# Patient Record
Sex: Male | Born: 1975
Health system: Southern US, Community
[De-identification: ages and names within clinical notes are randomized; demographics above are authoritative.]

## PROBLEM LIST (undated history)

## (undated) DIAGNOSIS — Z789 Other specified health status: Secondary | ICD-10-CM

## (undated) DIAGNOSIS — J189 Pneumonia, unspecified organism: Secondary | ICD-10-CM

## (undated) DIAGNOSIS — I10 Essential (primary) hypertension: Secondary | ICD-10-CM

## (undated) HISTORY — PX: EYE SURGERY: SHX253

## (undated) HISTORY — PX: TONSILLECTOMY: SUR1361

## (undated) HISTORY — PX: KNEE SURGERY: SHX244

---

## 2005-09-04 ENCOUNTER — Emergency Department (HOSPITAL_COMMUNITY): Admission: EM | Admit: 2005-09-04 | Discharge: 2005-09-04 | Payer: Self-pay | Admitting: Emergency Medicine

## 2006-07-26 ENCOUNTER — Emergency Department (HOSPITAL_COMMUNITY): Admission: EM | Admit: 2006-07-26 | Discharge: 2006-07-26 | Payer: Self-pay | Admitting: Emergency Medicine

## 2017-02-10 ENCOUNTER — Emergency Department (HOSPITAL_COMMUNITY)
Admission: EM | Admit: 2017-02-10 | Discharge: 2017-02-10 | Disposition: A | Discharge status: Home / Self Care | Payer: Worker's Compensation | Attending: Emergency Medicine | Admitting: Emergency Medicine

## 2017-02-10 ENCOUNTER — Emergency Department (HOSPITAL_COMMUNITY): Payer: Worker's Compensation

## 2017-02-10 DIAGNOSIS — Y9339 Activity, other involving climbing, rappelling and jumping off: Secondary | ICD-10-CM | POA: Diagnosis not present

## 2017-02-10 DIAGNOSIS — M25511 Pain in right shoulder: Secondary | ICD-10-CM

## 2017-02-10 DIAGNOSIS — Y929 Unspecified place or not applicable: Secondary | ICD-10-CM | POA: Diagnosis not present

## 2017-02-10 DIAGNOSIS — W1789XA Other fall from one level to another, initial encounter: Secondary | ICD-10-CM | POA: Diagnosis not present

## 2017-02-10 DIAGNOSIS — S4991XA Unspecified injury of right shoulder and upper arm, initial encounter: Secondary | ICD-10-CM | POA: Insufficient documentation

## 2017-02-10 DIAGNOSIS — Y998 Other external cause status: Secondary | ICD-10-CM | POA: Diagnosis not present

## 2017-02-10 DIAGNOSIS — S29011A Strain of muscle and tendon of front wall of thorax, initial encounter: Secondary | ICD-10-CM | POA: Insufficient documentation

## 2017-02-10 DIAGNOSIS — M7581 Other shoulder lesions, right shoulder: Secondary | ICD-10-CM

## 2017-02-10 MED ORDER — HYDROCODONE-ACETAMINOPHEN 5-325 MG PO TABS
2.0000 | ORAL_TABLET | Freq: Once | ORAL | Status: AC
  Administered 2017-02-10: 2 via ORAL
  Filled 2017-02-10: qty 2

## 2017-02-10 MED ORDER — HYDROCODONE-ACETAMINOPHEN 5-325 MG PO TABS: 2.0000 | ORAL_TABLET | ORAL | 0 refills | Status: DC | PRN

## 2017-02-10 NOTE — Discharge Instructions (Signed)
Follow-up with referred orthopedic doctor. Call his office and arrange for an appointment.  Take pain medications as directed for pain.  Make sure you're applying ice to help with the pain.  Return the emergency Department for any worsening pain, numbness or weakness, redness or swelling or any other worsening or concerning symptoms.

## 2017-02-10 NOTE — ED Provider Notes (Signed)
[facility]-EMERGENCY DEPT Provider Note   CSN: [id number] Arrival date & time: 02/10/17  1601  By signing my name below, I, [name], attest that this documentation has been prepared under the direction and in the presence of [name], PA-C. Electronically Signed: [name], Scribe. 02/10/2017. 5:05 PM.  History   Chief Complaint Chief Complaint  Patient presents with  . Shoulder Pain   The history is provided by the patient. No language interpreter was used.   HPI Comments: Mathew Cantrell is a 41 y.o. male who presents to the Emergency Department complaining of sudden onset, constant anterior right shoulder pain radiating into the biceps beginning earlier this afternoon. He was doing a high ropes course while harnessed prior to arrival. Patient states he and other climbers fell off a platform and became suspended in the air by their harnesses. He is unsure if his injury is a result of holding the rope with his right hand or another climber striking his right shoulder during the fall. Patient reports severe anterior right shoulder and biceps pain that is worse with movement of his right upper extremity. He endorses some associated swelling as well. Patient applied ice to his anterior right shoulder shortly after the injury was sustained but has otherwise not tried any medications or treatments. NKDA. Patient denies numbness/tingling, focal weakness, bruising, open wounds, or any other associated symptoms.  No past medical history on file.  There are no active problems to display for this patient.   No past surgical history on file.     Home Medications    Prior to Admission medications   Medication Sig Start Date End Date Taking? Authorizing Provider  HYDROcodone-acetaminophen (NORCO/VICODIN) 5-325 MG tablet Take 2 tablets by mouth every 4 (four) hours as needed. 02/10/17   Maxwell Caul, PA-C    Family History No family history on file.  Social History Social  History  Substance Use Topics  . Smoking status: Not on file  . Smokeless tobacco: Not on file  . Alcohol use Not on file     Allergies   Patient has no known allergies.   Review of Systems Review of Systems  Musculoskeletal: Positive for arthralgias, joint swelling and myalgias.  Skin: Negative for color change and wound.  Neurological: Negative for weakness and numbness.   Physical Exam Updated Vital Signs BP (!) 161/121 (BP Location: Left Arm)   Pulse 93   Temp 97.8 F (36.6 C) (Oral)   Resp 16   SpO2 99%   Physical Exam  Constitutional: He is oriented to person, place, and time. He appears well-developed and well-nourished.  Appears uncomfortable  HENT:  Head: Normocephalic and atraumatic.  Eyes: Conjunctivae and EOM are normal. Right eye exhibits no discharge. Left eye exhibits no discharge. No scleral icterus.  Cardiovascular:  Pulses:      Radial pulses are 2+ on the right side, and 2+ on the left side.  Pulmonary/Chest: Effort normal.  Tenderness palpation to the right pectoralis major muscle of the anterior chest with overlying soft tissue swelling.  Musculoskeletal:       Left shoulder: Normal.  Full range of motion of left shoulder. Decreased range of motion of right shoulder secondary to pain. Patient is able to get to about 100 abduction of right upper extremity with pain. No decreased strength. Worsening pain with external and internal rotation of the right upper extremity. Tenderness to palpation to the right forearm area overlying the biceps. No deformity or crepitus noted. No palpable  mass. Negative Hawkins, impingement, empty can test, lift off test. No tenderness palpation to the clavicles bilaterally. No deformity noted. No tenderness palpation to the right elbow, right wrist.  Neurological: He is alert and oriented to person, place, and time. GCS eye subscore is 4. GCS verbal subscore is 5. GCS motor subscore is 6.  Moving all extremities spontaneously,  though limited range of motion of right upper extremity. Sensation intact.   Skin: Skin is warm and dry.  Psychiatric: He has a normal mood and affect. His speech is normal and behavior is normal.  Nursing note and vitals reviewed.  ED Treatments / Results  Labs (all labs ordered are listed, but only abnormal results are displayed) Labs Reviewed - No data to display  EKG  EKG Interpretation None       Radiology Dg Shoulder Right  Result Date: 02/10/2017 CLINICAL DATA:  Pt c/o right shoulder pain onset today after falling off of a platform during training, was in a harness to prevent fall. Unsure of mechanism of injury, may have grabbed a rope and pulled or twisted his shoulder, friend on rope c.*comment was truncated* EXAM: RIGHT SHOULDER - 2+ VIEW COMPARISON:  None. FINDINGS: There is no evidence of fracture or dislocation. There is no evidence of arthropathy or other focal bone abnormality. Soft tissues are unremarkable. IMPRESSION: Negative. Electronically Signed   By: Norva PavlovElizabeth  Brown M.D.   On: 02/10/2017 16:55    Procedures Procedures (including critical care time)  DIAGNOSTIC STUDIES: Oxygen Saturation is 99% on RA, normal by my interpretation.    COORDINATION OF CARE: 5:02 PM Discussed treatment plan with pt at bedside and pt agreed to plan.  Medications Ordered in ED Medications  HYDROcodone-acetaminophen (NORCO/VICODIN) 5-325 MG per tablet 2 tablet (2 tablets Oral Given 02/10/17 1731)     Initial Impression / Assessment and Plan / ED Course  I have reviewed the triage vital signs and the nursing notes.  Pertinent labs & imaging results that were available during my care of the patient were reviewed by me and considered in my medical decision making (see chart for details).     41 year old male who presents with right shoulder/arm pain after a injury at a ropes course today. Patient is neurovascularly intact. Consider muscle strain versus ligament injury versus  tendon injury. Also consider fracture versus dislocation, the low suspicion. History/physical exam are not concerning for a rotator cuff pathology or biceps tendon tear. Initial x-rays ordered at triage. Analgesics given in the department.  X-ray reviewed. Negative for any acute fracture or dislocation. Discussed results with patient.  Discussed patient with Dr. Juleen ChinaKohut. Given history/physical exam, concern for a pectoralis muscle tear. Discussed concerns with patient. Will provide arm sling for comfort and support. Instructed patient to follow-up with referred orthopedic doctor for further evaluation. Will plan to give patient a short course of pain medications for symptomatic relief. Strict return precautions discussed. Patient expresses understanding and agreement plan.  Final Clinical Impressions(s) / ED Diagnoses   Final diagnoses:  Acute pain of right shoulder  Tendinitis of right pectoralis major    New Prescriptions Discharge Medication List as of 02/10/2017  6:54 PM    START taking these medications   Details  HYDROcodone-acetaminophen (NORCO/VICODIN) 5-325 MG tablet Take 2 tablets by mouth every 4 (four) hours as needed., Starting Fri 02/10/2017, Print       I personally performed the services described in this documentation, which was scribed in my presence. The recorded information has been reviewed  and is accurate.    Maxwell Caul, PA-C 02/10/17 2009    Raeford Razor, MD 02/17/17 1148

## 2017-02-10 NOTE — ED Triage Notes (Signed)
Pt c/o right shoulder pain onset today after falling off of a platform, was in a harness to prevent fall.  Unsure of mechanism of injury, may have grabbed a rope and pulled or twisted his shoulder, friend on rope course may have hit pt in shoulder as they fell.   Pain worse with ROM, initially pain was "stinger" like pain with numbness. No current numbness. Bulging to right anterior shoulder.

## 2017-02-10 NOTE — ED Provider Notes (Signed)
Medical screening examination/treatment/procedure(s) were conducted as a shared visit with non-physician practitioner(s) and myself.  I personally evaluated the patient during the encounter.   EKG Interpretation None       40yM with R shoulder pain. I suspect he may have torn R pec. Imaging negative. NVI. Sling. PRN pain meds. Ortho FU. He is a Emergency planning/management officerpolice officer. If he did indeed tear it then likely out for at least weeks. Activity instructions discussed.   Dg Shoulder Right  Result Date: 02/10/2017 CLINICAL DATA:  Pt c/o right shoulder pain onset today after falling off of a platform during training, was in a harness to prevent fall. Unsure of mechanism of injury, may have grabbed a rope and pulled or twisted his shoulder, friend on rope c.*comment was truncated* EXAM: RIGHT SHOULDER - 2+ VIEW COMPARISON:  None. FINDINGS: There is no evidence of fracture or dislocation. There is no evidence of arthropathy or other focal bone abnormality. Soft tissues are unremarkable. IMPRESSION: Negative. Electronically Signed   By: Norva PavlovElizabeth  Brown M.D.   On: 02/10/2017 16:55     Raeford RazorKohut, Sugey Trevathan, MD 02/22/17 [phone]

## 2018-07-23 ENCOUNTER — Encounter (HOSPITAL_BASED_OUTPATIENT_CLINIC_OR_DEPARTMENT_OTHER): Payer: Self-pay | Admitting: Emergency Medicine

## 2018-07-23 ENCOUNTER — Other Ambulatory Visit: Payer: Self-pay

## 2018-07-23 ENCOUNTER — Emergency Department (HOSPITAL_BASED_OUTPATIENT_CLINIC_OR_DEPARTMENT_OTHER): Payer: No Typology Code available for payment source

## 2018-07-23 ENCOUNTER — Emergency Department (HOSPITAL_BASED_OUTPATIENT_CLINIC_OR_DEPARTMENT_OTHER)
Admission: EM | Admit: 2018-07-23 | Discharge: 2018-07-23 | Disposition: A | Discharge status: Home / Self Care | Payer: No Typology Code available for payment source | Attending: Emergency Medicine | Admitting: Emergency Medicine

## 2018-07-23 DIAGNOSIS — M545 Low back pain, unspecified: Secondary | ICD-10-CM

## 2018-07-23 MED ORDER — FENTANYL CITRATE (PF) 100 MCG/2ML IJ SOLN
50.0000 ug | Freq: Once | INTRAMUSCULAR | Status: AC
  Administered 2018-07-23: 50 ug via INTRAVENOUS
  Filled 2018-07-23: qty 2

## 2018-07-23 MED ORDER — DIAZEPAM 10 MG PO TABS: 10.0000 mg | ORAL_TABLET | Freq: Three times a day (TID) | ORAL | 0 refills | Status: DC | PRN

## 2018-07-23 MED ORDER — METHOCARBAMOL 500 MG PO TABS
500.0000 mg | ORAL_TABLET | Freq: Once | ORAL | Status: AC
  Administered 2018-07-23: 500 mg via ORAL
  Filled 2018-07-23: qty 1

## 2018-07-23 MED ORDER — HYDROCODONE-ACETAMINOPHEN 5-325 MG PO TABS
1.0000 | ORAL_TABLET | Freq: Once | ORAL | Status: AC
  Administered 2018-07-23: 1 via ORAL
  Filled 2018-07-23: qty 1

## 2018-07-23 MED ORDER — MORPHINE SULFATE (PF) 4 MG/ML IV SOLN
4.0000 mg | Freq: Once | INTRAVENOUS | Status: AC
  Administered 2018-07-23: 4 mg via INTRAMUSCULAR
  Filled 2018-07-23: qty 1

## 2018-07-23 MED ORDER — KETOROLAC TROMETHAMINE 15 MG/ML IJ SOLN
15.0000 mg | Freq: Once | INTRAMUSCULAR | Status: AC
  Administered 2018-07-23: 15 mg via INTRAMUSCULAR
  Filled 2018-07-23: qty 1

## 2018-07-23 MED ORDER — DIAZEPAM 5 MG PO TABS
10.0000 mg | ORAL_TABLET | Freq: Once | ORAL | Status: AC
  Administered 2018-07-23: 10 mg via ORAL
  Filled 2018-07-23: qty 2

## 2018-07-23 MED FILL — diazePAM 10 MG TABS: 10 | 7 days supply | Qty: 20 | Fill #0

## 2018-07-23 NOTE — Discharge Instructions (Signed)
Please alternate Tylenol and Ibuprofen for pain Take Tizanidine as needed for muscle pain/spasms Take Valium as needed for spasms Apply heat to stiff/sore muscles Return if worsening

## 2018-07-23 NOTE — ED Provider Notes (Signed)
[facility] EMERGENCY DEPARTMENT Provider Note   CSN: [id number] Arrival date & time: 07/23/18  1034     History   Chief Complaint Chief Complaint  Patient presents with  . Back Pain    HPI Mathew Cantrell is a 42 y.o. male who presents with back pain.  No significant past medical history.  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  He had to crawl to get himself up and then had to help people around him who had also fallen over.  He initially had some mild to moderate low back pain and went to see a doctor and was prescribed muscle relaxers.  He states that he has been feeling well up and until this past week.  On Monday, Tuesday, Wednesday he exercised and felt okay.  About 3 days ago he started to have progressively worsening low back pain.  He started the muscle relaxer again which did not provide relief.  Today when he tried to get up to get ready for work he had intense whole back spasming and states that his back "locked up".  And he was not able to walk.  He denies any pain going down the legs.  No bowel or bladder incontinence. No fever, syncope, unexplained weight loss, hx of cancer, loss of bowel/bladder function, saddle anesthesia, urinary retention, IVDU.  HPI  History reviewed. No pertinent past medical history.  There are no active problems to display for this patient.   Past Surgical History:  Procedure Laterality Date  . KNEE SURGERY    . TONSILLECTOMY          Home Medications    Prior to Admission medications   Medication Sig Start Date End Date Taking? Authorizing Provider  tiZANidine (ZANAFLEX) 4 MG capsule  07/18/18   [provider]    Family History No family history on file.  Social History Social History   Tobacco Use  . Smoking status: Never Smoker  . Smokeless  tobacco: Never Used  Substance Use Topics  . Alcohol use: Not on file  . Drug use: Not on file     Allergies   Patient has no known allergies.   Review of Systems Review of Systems  Constitutional: Negative for fever.  Gastrointestinal: Negative for abdominal pain.  Musculoskeletal: Positive for back pain and myalgias.  Skin: Negative for wound.  Allergic/Immunologic: Negative for immunocompromised state.  Neurological: Negative for weakness and numbness.  All other systems reviewed and are negative.    Physical Exam Updated Vital Signs BP (!) 143/87   Pulse (!) 55   Temp 97.9 F (36.6 C) (Oral)   Resp 20   Ht 6\' 4"  (1.93 m)   Wt 115.7 kg   SpO2 100%   BMI 31.04 kg/m   Physical Exam  Constitutional: He is oriented to person, place, and time. He appears well-developed and well-nourished. He appears distressed (mildly due to pain).  Calm and cooperative  HENT:  Head: Normocephalic and atraumatic.  Eyes: Pupils are equal, round, and reactive to light. Conjunctivae are normal. Right eye exhibits no discharge. Left eye exhibits no discharge. No scleral icterus.  Neck: Normal range of motion.  Cardiovascular: Normal rate.  Pulmonary/Chest: Effort normal. No respiratory distress.  Abdominal: Soft. Bowel sounds are normal. He exhibits no distension.  There is no tenderness.  Musculoskeletal:  Back: Inspection: No masses, deformity, or rash Palpation: No midline spinal tenderness. No paraspinal muscle tenderness. Strength: 5/5 in lower extremities and normal plantar and dorsiflexion Sensation: Intact sensation with light touch in lower extremities bilaterally Gait: Pt is not able to stand   Neurological: He is alert and oriented to person, place, and time.  Skin: Skin is warm and dry.  Psychiatric: He has a normal mood and affect. His behavior is normal.  Nursing note and vitals reviewed.    ED Treatments / Results  Labs (all labs ordered are listed, but only  abnormal results are displayed) Labs Reviewed - No data to display  EKG None  Radiology Dg Lumbar Spine Complete  Result Date: 07/23/2018 CLINICAL DATA:  42 year old male with low back pain for the past month. Initial encounter. EXAM: LUMBAR SPINE - COMPLETE 4+ VIEW COMPARISON:  12/05/2007. FINDINGS: Slight curvature lumbar spine convex right. No compression fracture or pars defect. Mild L1-2 and L5-S1 disc space narrowing. Minimal Schmorl's node deformity L4-5. Sacroiliac joints appear intact. IMPRESSION: 1. Slight curvature lumbar spine convex right. 2. No compression fracture or pars defect. 3. Mild L1-2 and L5-S1 disc space narrowing. 4. Minimal Schmorl's node deformity L4-5. Electronically Signed   By: Lacy Duverney M.D.   On: 07/23/2018 12:28    Procedures Procedures (including critical care time)  Medications Ordered in ED Medications  ketorolac (TORADOL) 15 MG/ML injection 15 mg (15 mg Intramuscular Given 07/23/18 1201)  HYDROcodone-acetaminophen (NORCO/VICODIN) 5-325 MG per tablet 1 tablet (1 tablet Oral Given 07/23/18 1200)  methocarbamol (ROBAXIN) tablet 500 mg (500 mg Oral Given 07/23/18 1200)  morphine 4 MG/ML injection 4 mg (4 mg Intramuscular Given 07/23/18 1339)  diazepam (VALIUM) tablet 10 mg (10 mg Oral Given 07/23/18 1512)  fentaNYL (SUBLIMAZE) injection 50 mcg (50 mcg Intravenous Given 07/23/18 1519)     Initial Impression / Assessment and Plan / ED Course  I have reviewed the triage vital signs and the nursing notes.  Pertinent labs & imaging results that were available during my care of the patient were reviewed by me and considered in my medical decision making (see chart for details).  42 year old male presents with acute low back pain. Most likely due to muscle spasms. He has no radicular pain. No bowel/bladder incontinence. No red flags. Will obtain lumbar plain film due to his injury a couple weeks ago and provide pain control.  After Toradol, Norco,  Robaxin, the patient is not able to get up. Will apply heating pad and give IM Morphine.  Pt still not able to get up. Will give IV Fentanyl and Valium.   Reassess pt. He is able to stand and walk with some difficulty. He wants to go home. He will be given rx for Valium and advised to alternate Tylenol/Ibuprofen and use heating pads. Advised return if worsening  Final Clinical Impressions(s) / ED Diagnoses   Final diagnoses:  Acute bilateral low back pain without sciatica    ED Discharge Orders    None       Bethel Born, PA-C 07/23/18 1619    Jacalyn Lefevre, MD 07/24/18 [phone]

## 2018-07-23 NOTE — ED Triage Notes (Signed)
Pt c/o back pain; recent injury when he was smashed between 2 bike racks while working; back pain has become much worse since Sat and is now having spasms

## 2018-08-05 ENCOUNTER — Emergency Department (HOSPITAL_COMMUNITY): Payer: BC Managed Care – PPO

## 2018-08-05 ENCOUNTER — Encounter (HOSPITAL_COMMUNITY): Payer: Self-pay | Admitting: Emergency Medicine

## 2018-08-05 ENCOUNTER — Emergency Department (HOSPITAL_COMMUNITY)
Admission: EM | Admit: 2018-08-05 | Discharge: 2018-08-05 | Disposition: A | Discharge status: Home / Self Care | Payer: BC Managed Care – PPO | Attending: Emergency Medicine | Admitting: Emergency Medicine

## 2018-08-05 ENCOUNTER — Other Ambulatory Visit: Payer: Self-pay

## 2018-08-05 DIAGNOSIS — Y998 Other external cause status: Secondary | ICD-10-CM | POA: Diagnosis not present

## 2018-08-05 DIAGNOSIS — Z79899 Other long term (current) drug therapy: Secondary | ICD-10-CM | POA: Diagnosis not present

## 2018-08-05 DIAGNOSIS — Y9389 Activity, other specified: Secondary | ICD-10-CM | POA: Insufficient documentation

## 2018-08-05 DIAGNOSIS — S4991XA Unspecified injury of right shoulder and upper arm, initial encounter: Secondary | ICD-10-CM | POA: Diagnosis not present

## 2018-08-05 DIAGNOSIS — M25511 Pain in right shoulder: Secondary | ICD-10-CM

## 2018-08-05 DIAGNOSIS — W010XXA Fall on same level from slipping, tripping and stumbling without subsequent striking against object, initial encounter: Secondary | ICD-10-CM | POA: Diagnosis not present

## 2018-08-05 DIAGNOSIS — Y929 Unspecified place or not applicable: Secondary | ICD-10-CM | POA: Diagnosis not present

## 2018-08-05 MED ORDER — ACETAMINOPHEN 500 MG PO TABS
1000.0000 mg | ORAL_TABLET | Freq: Once | ORAL | Status: AC
  Administered 2018-08-05: 1000 mg via ORAL
  Filled 2018-08-05: qty 2

## 2018-08-05 MED ORDER — ACETAMINOPHEN 500 MG PO TABS: 1000.0000 mg | ORAL_TABLET | Freq: Three times a day (TID) | ORAL | 0 refills | Status: DC

## 2018-08-05 MED ORDER — ACETAMINOPHEN 500 MG PO TABS: 1000.0000 mg | ORAL_TABLET | Freq: Three times a day (TID) | ORAL | 0 refills | Status: AC

## 2018-08-05 MED ORDER — HYDROCODONE-ACETAMINOPHEN 5-325 MG PO TABS: 1.0000 | ORAL_TABLET | Freq: Three times a day (TID) | ORAL | 0 refills | Status: DC | PRN

## 2018-08-05 NOTE — ED Triage Notes (Signed)
Patient fell when taking out the dogs and fell on his left shoulder. Patient thinks it might be broke.

## 2018-08-05 NOTE — ED Notes (Signed)
Bed: [id number] Expected date:  Expected time:  Means of arrival:  Comments: Room 1

## 2018-08-05 NOTE — ED Provider Notes (Signed)
[facility]-EMERGENCY DEPT Provider Note  CSN: [id number] Arrival date & time: 08/05/18 [id number]  Chief Complaint(s) Shoulder Injury  HPI Mathew Cantrell is a 42 y.o. male who presents to the emergency department with right shoulder pain following a mechanical fall less than 1 hour prior to arrival.  The patient reports that he was out drinking at a Christmas party.  When he came home he was walking a dog.  The dog yanked them and the patient lost his balance causing him to fall forward onto his right shoulder.  He immediately felt severe throbbing pain.  Nonradiating.  Exacerbated with range of motion and palpation of the right shoulder.  Alleviated by mobility.  Denies any other trauma including head injury.  He denies any neck pain, back pain, chest pain, abdominal pain.  HPI  Past Medical History History reviewed. No pertinent past medical history. There are no active problems to display for this patient.  Home Medication(s) Prior to Admission medications   Medication Sig Start Date End Date Taking? Authorizing Provider  diazepam (VALIUM) 10 MG tablet Take 1 tablet (10 mg total) by mouth every 8 (eight) hours as needed for muscle spasms. 07/23/18  Yes Bethel BornGekas, Kelly Marie, PA-C  tiZANidine (ZANAFLEX) 4 MG capsule Take 4 mg by mouth 3 (three) times daily as needed for muscle spasms.  07/18/18  Yes [provider]  acetaminophen (TYLENOL) 500 MG tablet Take 2 tablets (1,000 mg total) by mouth every 8 (eight) hours for 5 days. Do not take more than 4000 mg of acetaminophen (Tylenol) in a 24-hour period. Please note that other medicines that you may be prescribed may have Tylenol as well. 08/05/18 08/10/18  Nira Connardama, Pedro Eduardo, MD  HYDROcodone-acetaminophen (NORCO/VICODIN) 5-325 MG tablet Take 1 tablet by mouth every 8 (eight) hours as needed for up to 5 days for severe pain (That is not improved by your scheduled acetaminophen regimen). Please do not exceed 4000 mg of  acetaminophen (Tylenol) a 24-hour period. Please note that he may be prescribed additional medicine that contains acetaminophen. 08/05/18 08/10/18  Nira Connardama, Pedro Eduardo, MD                                                                                                                                    Past Surgical History Past Surgical History:  Procedure Laterality Date  . KNEE SURGERY    . TONSILLECTOMY     Family History History reviewed. No pertinent family history.  Social History Social History   Tobacco Use  . Smoking status: Never Smoker  . Smokeless tobacco: Never Used  Substance Use Topics  . Alcohol use: Not on file  . Drug use: Not on file   Allergies Patient has no known allergies.  Review of Systems Review of Systems All other systems are reviewed and are negative for acute change except as noted in the HPI  Physical Exam Vital Signs  I have reviewed the triage vital signs BP (!) 161/115 (BP Location: Left Arm)   Pulse 100   Temp 98 F (36.7 C) (Oral)   Resp 18   Ht 6\' 4"  (1.93 m)   Wt 115.7 kg   SpO2 95%   BMI 31.04 kg/m   Physical Exam  Constitutional: He is oriented to person, place, and time. He appears well-developed and well-nourished. No distress.  HENT:  Head: Normocephalic.  Right Ear: External ear normal.  Left Ear: External ear normal.  Mouth/Throat: Oropharynx is clear and moist.  Eyes: Pupils are equal, round, and reactive to light. Conjunctivae and EOM are normal. Right eye exhibits no discharge. Left eye exhibits no discharge. No scleral icterus.  Neck: Normal range of motion. Neck supple.  Cardiovascular: Regular rhythm and normal heart sounds. Exam reveals no gallop and no friction rub.  No murmur heard. Pulses:      Radial pulses are 2+ on the right side, and 2+ on the left side.       Dorsalis pedis pulses are 2+ on the right side, and 2+ on the left side.  Pulmonary/Chest: Effort normal and breath sounds normal. No stridor.  No respiratory distress.  Abdominal: Soft. He exhibits no distension. There is no tenderness.  Musculoskeletal:       Right shoulder: He exhibits tenderness, bony tenderness (to [facility] joint) and deformity (to [facility] joint). He exhibits normal range of motion.       Cervical back: He exhibits no bony tenderness.       Thoracic back: He exhibits no bony tenderness.       Lumbar back: He exhibits no bony tenderness.  Clavicle stable. Chest stable to AP/Lat compression. Pelvis stable to Lat compression. No obvious extremity deformity. No chest or abdominal wall contusion.  Neurological: He is alert and oriented to person, place, and time. GCS eye subscore is 4. GCS verbal subscore is 5. GCS motor subscore is 6.  Moving all extremities   Skin: Skin is warm. He is not diaphoretic.    ED Results and Treatments Labs (all labs ordered are listed, but only abnormal results are displayed) Labs Reviewed - No data to display                                                                                                                       EKG  EKG Interpretation  Date/Time:    Ventricular Rate:    PR Interval:    QRS Duration:   QT Interval:    QTC Calculation:   R Axis:     Text Interpretation:        Radiology Dg Shoulder Right  Result Date: 08/05/2018 CLINICAL DATA:  42 year old male with fall and right shoulder injury. EXAM: RIGHT SHOULDER - 2+ VIEW COMPARISON:  Right shoulder radiograph dated 02/10/2017 FINDINGS: There is no acute fracture or dislocation. The bones are well mineralized. No arthritic changes. There is elevation of the clavicle in relation to the chromium  which has increased since the prior radiograph. The undersurface of the clavicle is almost at the level of the superior margin of the acromion. The soft tissues are unremarkable. IMPRESSION: 1. No acute fracture or dislocation. 2. Right acromioclavicular sprain/segment is injury, progressed since the prior radiograph.  Electronically Signed   By: Elgie Collard M.D.   On: 08/05/2018 04:26   Pertinent labs & imaging results that were available during my care of the patient were reviewed by me and considered in my medical decision making (see chart for details).  Medications Ordered in ED Medications  acetaminophen (TYLENOL) tablet 1,000 mg (1,000 mg Oral Given 08/05/18 0431)                                                                                                                                    Procedures Procedures  (including critical care time)  Medical Decision Making / ED Course I have reviewed the nursing notes for this encounter and the patient's prior records (if available in EHR or on provided paperwork).    Mechanical fall resulting in right AC joint dislocation.  Type II versus 3.  Patient provided with oral pain medicine and a sling.  Recommended orthopedic follow-up.  The patient appears reasonably screened and/or stabilized for discharge and I doubt any other medical condition or other [facility] requiring further screening, evaluation, or treatment in the ED at this time prior to discharge.  The patient is safe for discharge with strict return precautions.   Final Clinical Impression(s) / ED Diagnoses Final diagnoses:  None    Disposition: Discharge  Condition: Good  I have discussed the results, Dx and Tx plan with the patient who expressed understanding and agree(s) with the plan. Discharge instructions discussed at great length. The patient was given strict return precautions who verbalized understanding of the instructions. No further questions at time of discharge.    ED Discharge Orders         Ordered    acetaminophen (TYLENOL) 500 MG tablet  Every 8 hours     08/05/18 0427    HYDROcodone-acetaminophen (NORCO/VICODIN) 5-325 MG tablet  Every 8 hours PRN     08/05/18 0427           Follow Up: Eldred Manges, MD [address] [place]  [id number] [phone]  Schedule an appointment as soon as possible for a visit  For close follow up to assess for [facility] dislocation     This chart was dictated using voice recognition software.  Despite best efforts to proofread,  errors can occur which can change the documentation meaning.   Nira Conn, MD 08/05/18 [phone]

## 2018-08-05 NOTE — ED Notes (Signed)
Pt verbalized discharge instructions and follow up care. Pt's girlfriend and friend are taking him home.

## 2018-08-06 ENCOUNTER — Ambulatory Visit (INDEPENDENT_AMBULATORY_CARE_PROVIDER_SITE_OTHER): Payer: BC Managed Care – PPO | Admitting: Orthopedic Surgery

## 2018-08-06 ENCOUNTER — Encounter (INDEPENDENT_AMBULATORY_CARE_PROVIDER_SITE_OTHER): Payer: Self-pay | Admitting: Orthopaedic Surgery

## 2018-08-06 ENCOUNTER — Ambulatory Visit (INDEPENDENT_AMBULATORY_CARE_PROVIDER_SITE_OTHER): Payer: BC Managed Care – PPO

## 2018-08-06 VITALS — BP 129/92 | HR 89 | Ht 76.0 in | Wt 255.0 lb

## 2018-08-06 DIAGNOSIS — S43101A Unspecified dislocation of right acromioclavicular joint, initial encounter: Secondary | ICD-10-CM

## 2018-08-06 DIAGNOSIS — S43121A Dislocation of right acromioclavicular joint, 100%-200% displacement, initial encounter: Secondary | ICD-10-CM

## 2018-08-06 DIAGNOSIS — M25511 Pain in right shoulder: Secondary | ICD-10-CM

## 2018-08-06 NOTE — Progress Notes (Signed)
Office Visit Note   Patient: Mathew Cantrell           Date of Birth: 1976-03-30           MRN: [id number] Visit Date: 08/06/2018              Requested by: No referring provider defined for this encounter. PCP: Patient, No Pcp Per   Assessment & Plan: Visit Diagnoses:  1. Acute pain of right shoulder   2. AC separation, type 3, right, initial encounter     Plan: I advised patient and his wife was present that with the injury that he has this will need surgical repair.  I did show Dr. Dorene Grebe pictures that were obtained today.  He will speak with patient this afternoon about what is involved with surgery and recovery time.  I reviewed x-rays with patient and his wife.  Questions answered.  Follow-Up Instructions: Return for 1 week postop with [name].   Orders:  Orders Placed This Encounter  Procedures  . XR AC Joints   No orders of the defined types were placed in this encounter.     Procedures: No procedures performed   Clinical Data: No additional findings.   Subjective: Chief Complaint  Patient presents with  . 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  Patient states that yesterday he was walking his 60 to 65 pound [place] when the dog yanked him causing him to fall directly onto his right shoulder.  Immediate pain in the shoulder aggravated with attempted movement.  He was seen in the emergency room and x-rays showed:  EXAM: RIGHT SHOULDER - 2+ VIEW  COMPARISON:  Right shoulder radiograph dated 02/10/2017  FINDINGS: There is no acute fracture or dislocation. The bones are well mineralized. No arthritic changes. There is elevation of the clavicle in relation to the chromium which has increased since the prior radiograph. The undersurface of the clavicle is almost at the level of the superior margin of the acromion. The soft tissues  are unremarkable.  IMPRESSION: 1. No acute fracture or dislocation. 2. Right acromioclavicular sprain/segment is injury, progressed since the prior radiograph.  Patient was put into a shoulder immobilizer.  Continues to have pain in the shoulder.  No other injury from the fall yesterday.  Patient did have a Worker's Comp. related injury to his back October 2019.  States that he has been out of work since his injury.  He states that since his work-related back injury he has been feeling off balance which sounds like it may have contributed to his fall when his dog pulled him.  He has not been seen by back specialist. Review of Systems No current cardiac pulmonary GI GU issues  Objective: Vital Signs: BP (!) 129/92   Pulse 89   Ht 6\' 4"  (1.93 m)   Wt 255 lb (115.7 kg)   BMI 31.04 kg/m   Physical Exam  Constitutional: He is oriented to person, place, and time. No distress.  HENT:  Head: Normocephalic and atraumatic.  Eyes: Pupils are equal, round, and reactive to light. EOM are normal.  Neck: Normal range of motion.  Pulmonary/Chest: No respiratory distress.  Musculoskeletal:  Patient in shoulder immobilizer.  He does have some swelling of the [facility] joint with deformity.  Neurological: He is alert and oriented to person, place, and time.  Skin:  Skin is warm and dry.    Ortho Exam  Specialty Comments:  No specialty comments available.  Imaging: Xr Ac Joints  Result Date: 08/06/2018 X-ray right shoulder with and without weights show type III AC separation.    PMFS History: There are no active problems to display for this patient.  History reviewed. No pertinent past medical history.  History reviewed. No pertinent family history.  Past Surgical History:  Procedure Laterality Date  . KNEE SURGERY    . TONSILLECTOMY     Social History   Occupational History  . Not on file  Tobacco Use  . Smoking status: Never Smoker  . Smokeless tobacco: Never Used  Substance and  Sexual Activity  . Alcohol use: Not on file  . Drug use: Not on file  . Sexual activity: Not on file

## 2018-08-08 ENCOUNTER — Encounter (INDEPENDENT_AMBULATORY_CARE_PROVIDER_SITE_OTHER): Payer: Self-pay | Admitting: Orthopedic Surgery

## 2018-08-08 ENCOUNTER — Encounter (HOSPITAL_COMMUNITY): Payer: Self-pay | Admitting: *Deleted

## 2018-08-08 ENCOUNTER — Other Ambulatory Visit (INDEPENDENT_AMBULATORY_CARE_PROVIDER_SITE_OTHER): Payer: Self-pay | Admitting: Orthopedic Surgery

## 2018-08-08 DIAGNOSIS — S43101A Unspecified dislocation of right acromioclavicular joint, initial encounter: Secondary | ICD-10-CM

## 2018-08-08 NOTE — Progress Notes (Signed)
Office Visit Note   Patient: Mathew Cantrell           Date of Birth: [birth year]           MRN: [id number] Visit Date: 08/06/2018 Requested by: No referring provider defined for this encounter. PCP: Patient, No Pcp Per  Subjective: No chief complaint on file.   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  He had a partial rotator cuff tear and pec tear 1/2 years ago which was an on-the-job injury.  Pec tear was never repaired.  He has been doing some rehab and in general the shoulder was becoming more functional.  However he did fall directly onto the right shoulder 3 days ago and sustained injury to the [facility] joint.  Radiographs do demonstrate at least grade 3 possible grade 5 injury.  He is very significantly painful with that right arm.  He does enjoy lifting weights and doing physical activities.  His right shoulder he states has never really recovered from the strength loss from the pec rupture which was not repaired.  Patient also has some ongoing back issues.  That may require further work-up as part of a Workmen's Comp. claim.  This injury however is not related to [employer]  He is right-hand dominant.              ROS: All systems reviewed are negative as they relate to the chief complaint within the history of present illness.  Patient denies  fevers or chills.   Assessment & Plan: Visit Diagnoses: No diagnosis found.  Plan: Impression is at least grade 3 right [facility] joint injury which is acute.  Shoulder already has some dysfunction out the due to pec tendon rupture which was not repaired.  Had partial rotator cuff tear before which was treated without surgery.  Plan at this time is [facility] joint repair acutely using tight rope implant.  Risk and benefits are discussed including but not limited to infection nerve vessel damage incomplete restoration of normal shoulder function and persistent pain.  Patient understands the risk and  benefits and wishes to proceed.  All questions answered.  I do not anticipate that he will risk gain full range of motion of the shoulder but I do think with the [facility] joint reconstructed and with the ligaments healed that his shoulder will feel more normal than it would without that intervention and with his pec rupture.  Follow-Up Instructions: No follow-ups on file.   Orders:  No orders of the defined types were placed in this encounter.  No orders of the defined types were placed in this encounter.     Procedures: No procedures performed   Clinical Data: No additional findings.  Objective: Vital Signs: There were no vitals taken for this visit.  Physical Exam:   Constitutional: Patient appears well-developed HEENT:  Head: Normocephalic Eyes:EOM are normal Neck: Normal range of motion Cardiovascular: Normal rate Pulmonary/chest: Effort normal Neurologic: Patient is alert Skin: Skin is warm Psychiatric: Patient has normal mood and affect    Ortho Exam: Ortho exam demonstrates good motor sensory function to the hand with intact radial pulse.  He does have visible AC joint deformity.  Tattoos present around the right shoulder.  He has generally a lot of pain with range of motion of that right arm.  There is visible deformity of the right shoulder compared to the left even 3 days post injury.  Specialty Comments:  No specialty  comments available.  Imaging: No results found.   PMFS History: There are no active problems to display for this patient.  History reviewed. No pertinent past medical history.  History reviewed. No pertinent family history.  Past Surgical History:  Procedure Laterality Date  . KNEE SURGERY    . TONSILLECTOMY     Social History   Occupational History  . Not on file  Tobacco Use  . Smoking status: Never Smoker  . Smokeless tobacco: Never Used  Substance and Sexual Activity  . Alcohol use: Not on file  . Drug use: Not on file  . Sexual  activity: Not on file

## 2018-08-09 ENCOUNTER — Ambulatory Visit (HOSPITAL_COMMUNITY): Payer: BC Managed Care – PPO | Admitting: Anesthesiology

## 2018-08-09 ENCOUNTER — Encounter (HOSPITAL_COMMUNITY): Payer: Self-pay

## 2018-08-09 ENCOUNTER — Ambulatory Visit (HOSPITAL_COMMUNITY): Payer: BC Managed Care – PPO

## 2018-08-09 ENCOUNTER — Encounter (HOSPITAL_COMMUNITY)
Admission: RE | Disposition: A | Discharge status: Home / Self Care | Payer: Self-pay | Source: Home / Self Care | Attending: Orthopedic Surgery

## 2018-08-09 ENCOUNTER — Ambulatory Visit (HOSPITAL_COMMUNITY)
Admission: RE | Admit: 2018-08-09 | Discharge: 2018-08-09 | Disposition: A | Discharge status: Home / Self Care | Payer: BC Managed Care – PPO | Attending: Orthopedic Surgery | Admitting: Orthopedic Surgery

## 2018-08-09 ENCOUNTER — Other Ambulatory Visit: Payer: Self-pay

## 2018-08-09 DIAGNOSIS — Z419 Encounter for procedure for purposes other than remedying health state, unspecified: Secondary | ICD-10-CM

## 2018-08-09 DIAGNOSIS — S43101A Unspecified dislocation of right acromioclavicular joint, initial encounter: Secondary | ICD-10-CM | POA: Diagnosis present

## 2018-08-09 DIAGNOSIS — X58XXXA Exposure to other specified factors, initial encounter: Secondary | ICD-10-CM | POA: Insufficient documentation

## 2018-08-09 HISTORY — PX: ACROMIO-CLAVICULAR JOINT REPAIR: SHX5183

## 2018-08-09 HISTORY — DX: Other specified health status: Z78.9

## 2018-08-09 SURGERY — REPAIR, ACROMIOCLAVICULAR JOINT
Anesthesia: Regional | Site: Shoulder | Laterality: Right

## 2018-08-09 MED ORDER — ONDANSETRON HCL 4 MG/2ML IJ SOLN
INTRAMUSCULAR | Status: AC
  Filled 2018-08-09: qty 2

## 2018-08-09 MED ORDER — BUPIVACAINE HCL (PF) 0.25 % IJ SOLN
INTRAMUSCULAR | Status: AC
  Filled 2018-08-09: qty 30

## 2018-08-09 MED ORDER — DEXAMETHASONE SODIUM PHOSPHATE 10 MG/ML IJ SOLN
INTRAMUSCULAR | Status: DC | PRN
  Administered 2018-08-09: 10 mg via INTRAVENOUS

## 2018-08-09 MED ORDER — DEXAMETHASONE SODIUM PHOSPHATE 10 MG/ML IJ SOLN
INTRAMUSCULAR | Status: AC
  Filled 2018-08-09: qty 1

## 2018-08-09 MED ORDER — MORPHINE SULFATE (PF) 4 MG/ML IV SOLN
INTRAVENOUS | Status: AC
  Filled 2018-08-09: qty 2

## 2018-08-09 MED ORDER — OXYCODONE HCL 5 MG PO TABS: 5.0000 mg | ORAL_TABLET | Freq: Once | ORAL | Status: DC | PRN

## 2018-08-09 MED ORDER — DEXMEDETOMIDINE HCL 200 MCG/2ML IV SOLN
INTRAVENOUS | Status: DC | PRN
  Administered 2018-08-09: 16 ug via INTRAVENOUS
  Administered 2018-08-09: 4 ug via INTRAVENOUS

## 2018-08-09 MED ORDER — FENTANYL CITRATE (PF) 100 MCG/2ML IJ SOLN
INTRAMUSCULAR | Status: AC
  Administered 2018-08-09: 50 ug via INTRAVENOUS
  Filled 2018-08-09: qty 2

## 2018-08-09 MED ORDER — CHLORHEXIDINE GLUCONATE 4 % EX LIQD: 60.0000 mL | Freq: Once | CUTANEOUS | Status: DC

## 2018-08-09 MED ORDER — OXYCODONE HCL 5 MG/5ML PO SOLN: 5.0000 mg | Freq: Once | ORAL | Status: DC | PRN

## 2018-08-09 MED ORDER — CEFAZOLIN SODIUM-DEXTROSE 2-4 GM/100ML-% IV SOLN
2.0000 g | INTRAVENOUS | Status: AC
  Administered 2018-08-09: 2 g via INTRAVENOUS
  Filled 2018-08-09: qty 100

## 2018-08-09 MED ORDER — FENTANYL CITRATE (PF) 100 MCG/2ML IJ SOLN
INTRAMUSCULAR | Status: DC | PRN
  Administered 2018-08-09: 25 ug via INTRAVENOUS
  Administered 2018-08-09: 150 ug via INTRAVENOUS

## 2018-08-09 MED ORDER — FENTANYL CITRATE (PF) 250 MCG/5ML IJ SOLN
INTRAMUSCULAR | Status: AC
  Filled 2018-08-09: qty 5

## 2018-08-09 MED ORDER — MIDAZOLAM HCL 2 MG/2ML IJ SOLN
INTRAMUSCULAR | Status: AC
  Administered 2018-08-09: 1 mg via INTRAVENOUS
  Filled 2018-08-09: qty 2

## 2018-08-09 MED ORDER — PHENYLEPHRINE 40 MCG/ML (10ML) SYRINGE FOR IV PUSH (FOR BLOOD PRESSURE SUPPORT)
PREFILLED_SYRINGE | INTRAVENOUS | Status: DC | PRN
  Administered 2018-08-09: 120 ug via INTRAVENOUS
  Administered 2018-08-09 (×2): 80 ug via INTRAVENOUS

## 2018-08-09 MED ORDER — SODIUM CHLORIDE 0.9 % IV SOLN
INTRAVENOUS | Status: DC | PRN
  Administered 2018-08-09: 40 ug/min via INTRAVENOUS

## 2018-08-09 MED ORDER — HYDROMORPHONE HCL 1 MG/ML IJ SOLN: 0.2500 mg | INTRAMUSCULAR | Status: DC | PRN

## 2018-08-09 MED ORDER — PROPOFOL 10 MG/ML IV BOLUS
INTRAVENOUS | Status: DC | PRN
  Administered 2018-08-09: 150 mg via INTRAVENOUS
  Administered 2018-08-09: 50 mg via INTRAVENOUS

## 2018-08-09 MED ORDER — ONDANSETRON HCL 4 MG/2ML IJ SOLN
INTRAMUSCULAR | Status: DC | PRN
  Administered 2018-08-09: 4 mg via INTRAVENOUS

## 2018-08-09 MED ORDER — MIDAZOLAM HCL 2 MG/2ML IJ SOLN
1.0000 mg | Freq: Once | INTRAMUSCULAR | Status: AC
  Administered 2018-08-09: 1 mg via INTRAVENOUS

## 2018-08-09 MED ORDER — EPHEDRINE SULFATE 50 MG/ML IJ SOLN
INTRAMUSCULAR | Status: DC | PRN
  Administered 2018-08-09 (×6): 5 mg via INTRAVENOUS

## 2018-08-09 MED ORDER — ESMOLOL HCL 100 MG/10ML IV SOLN
INTRAVENOUS | Status: DC | PRN
  Administered 2018-08-09: 30 mg via INTRAVENOUS

## 2018-08-09 MED ORDER — ROCURONIUM BROMIDE 50 MG/5ML IV SOSY
PREFILLED_SYRINGE | INTRAVENOUS | Status: AC
  Filled 2018-08-09: qty 5

## 2018-08-09 MED ORDER — FENTANYL CITRATE (PF) 100 MCG/2ML IJ SOLN
50.0000 ug | Freq: Once | INTRAMUSCULAR | Status: AC
  Administered 2018-08-09: 50 ug via INTRAVENOUS

## 2018-08-09 MED ORDER — SUGAMMADEX SODIUM 500 MG/5ML IV SOLN
INTRAVENOUS | Status: AC
  Filled 2018-08-09: qty 5

## 2018-08-09 MED ORDER — ROPIVACAINE HCL 5 MG/ML IJ SOLN
INTRAMUSCULAR | Status: DC | PRN
  Administered 2018-08-09: 30 mL via PERINEURAL

## 2018-08-09 MED ORDER — CLONIDINE HCL (ANALGESIA) 100 MCG/ML EP SOLN
EPIDURAL | Status: DC | PRN
  Administered 2018-08-09: 100 ug

## 2018-08-09 MED ORDER — SUGAMMADEX SODIUM 200 MG/2ML IV SOLN
INTRAVENOUS | Status: DC | PRN
  Administered 2018-08-09: 300 mg via INTRAVENOUS

## 2018-08-09 MED ORDER — LACTATED RINGERS IV SOLN
INTRAVENOUS | Status: DC
  Administered 2018-08-09 (×2): via INTRAVENOUS

## 2018-08-09 MED ORDER — LIDOCAINE 2% (20 MG/ML) 5 ML SYRINGE
INTRAMUSCULAR | Status: AC
  Filled 2018-08-09: qty 5

## 2018-08-09 MED ORDER — ROCURONIUM BROMIDE 10 MG/ML (PF) SYRINGE
PREFILLED_SYRINGE | INTRAVENOUS | Status: DC | PRN
  Administered 2018-08-09 (×2): 20 mg via INTRAVENOUS
  Administered 2018-08-09: 30 mg via INTRAVENOUS
  Administered 2018-08-09: 50 mg via INTRAVENOUS

## 2018-08-09 MED ORDER — FENTANYL CITRATE (PF) 100 MCG/2ML IJ SOLN
50.0000 ug | Freq: Once | INTRAMUSCULAR | Status: AC
  Administered 2018-08-09: 11:00:00 via INTRAVENOUS

## 2018-08-09 MED ORDER — PROMETHAZINE HCL 25 MG/ML IJ SOLN: 6.2500 mg | INTRAMUSCULAR | Status: DC | PRN

## 2018-08-09 MED ORDER — LIDOCAINE 2% (20 MG/ML) 5 ML SYRINGE
INTRAMUSCULAR | Status: DC | PRN
  Administered 2018-08-09: 50 mg via INTRAVENOUS

## 2018-08-09 MED ORDER — PROPOFOL 10 MG/ML IV BOLUS
INTRAVENOUS | Status: AC
  Filled 2018-08-09: qty 20

## 2018-08-09 MED ORDER — 0.9 % SODIUM CHLORIDE (POUR BTL) OPTIME
TOPICAL | Status: DC | PRN
  Administered 2018-08-09 (×2): 1000 mL

## 2018-08-09 SURGICAL SUPPLY — 54 items
BIT DRILL CANNULATED AC REPAIR (INSTRUMENTS) ×1 IMPLANT
CLOSURE STERI-STRIP 1/4X4 (GAUZE/BANDAGES/DRESSINGS) ×3 IMPLANT
CLOSURE WOUND 1/2 X4 (GAUZE/BANDAGES/DRESSINGS) ×1
COVER SURGICAL LIGHT HANDLE (MISCELLANEOUS) ×3 IMPLANT
COVER WAND RF STERILE (DRAPES) ×3 IMPLANT
DRAPE C-ARM 42X72 X-RAY (DRAPES) ×3 IMPLANT
DRAPE INCISE IOBAN 66X45 STRL (DRAPES) ×6 IMPLANT
DRAPE ORTHO SPLIT 77X108 STRL (DRAPES) ×4
DRAPE SURG ORHT 6 SPLT 77X108 (DRAPES) ×2 IMPLANT
DRAPE U-SHAPE 47X51 STRL (DRAPES) ×6 IMPLANT
DRILL CANNULATED AC REPAIR (INSTRUMENTS) ×3
DRSG AQUACEL AG ADV 3.5X 6 (GAUZE/BANDAGES/DRESSINGS) IMPLANT
DRSG AQUACEL AG ADV 3.5X10 (GAUZE/BANDAGES/DRESSINGS) ×3 IMPLANT
DRSG AQUACEL AG ADV 3.5X14 (GAUZE/BANDAGES/DRESSINGS) IMPLANT
DURAPREP 26ML APPLICATOR (WOUND CARE) ×3 IMPLANT
ELECT BLADE 4.0 EZ CLEAN MEGAD (MISCELLANEOUS) ×3
ELECT REM PT RETURN 9FT ADLT (ELECTROSURGICAL) ×3
ELECTRODE BLDE 4.0 EZ CLN MEGD (MISCELLANEOUS) ×1 IMPLANT
ELECTRODE REM PT RTRN 9FT ADLT (ELECTROSURGICAL) ×1 IMPLANT
FIBERSTICK 2 (SUTURE) ×6 IMPLANT
GLOVE BIOGEL PI IND STRL 8 (GLOVE) ×1 IMPLANT
GLOVE BIOGEL PI INDICATOR 8 (GLOVE) ×2
GLOVE SURG ORTHO 8.0 STRL STRW (GLOVE) ×12 IMPLANT
GOWN STRL REUS W/ TWL LRG LVL3 (GOWN DISPOSABLE) ×2 IMPLANT
GOWN STRL REUS W/ TWL XL LVL3 (GOWN DISPOSABLE) ×1 IMPLANT
GOWN STRL REUS W/TWL LRG LVL3 (GOWN DISPOSABLE) ×4
GOWN STRL REUS W/TWL XL LVL3 (GOWN DISPOSABLE) ×2
KIT BASIN OR (CUSTOM PROCEDURE TRAY) ×3 IMPLANT
KIT TURNOVER KIT B (KITS) ×3 IMPLANT
MANIFOLD NEPTUNE II (INSTRUMENTS) ×3 IMPLANT
NEEDLE HYPO 25GX1X1/2 BEV (NEEDLE) IMPLANT
NS IRRIG 1000ML POUR BTL (IV SOLUTION) ×6 IMPLANT
PACK SHOULDER (CUSTOM PROCEDURE TRAY) ×3 IMPLANT
PAD ARMBOARD 7.5X6 YLW CONV (MISCELLANEOUS) ×6 IMPLANT
SLING ARM IMMOBILIZER LRG (SOFTGOODS) IMPLANT
SLING ARM IMMOBILIZER MED (SOFTGOODS) IMPLANT
SLING ARM IMMOBILIZER XL (CAST SUPPLIES) ×3 IMPLANT
SPONGE LAP 4X18 RFD (DISPOSABLE) ×3 IMPLANT
STRIP CLOSURE SKIN 1/2X4 (GAUZE/BANDAGES/DRESSINGS) ×2 IMPLANT
SUCTION FRAZIER HANDLE 10FR (MISCELLANEOUS)
SUCTION TUBE FRAZIER 10FR DISP (MISCELLANEOUS) IMPLANT
SUT MNCRL AB 3-0 PS2 18 (SUTURE) ×3 IMPLANT
SUT MNCRL AB 4-0 PS2 18 (SUTURE) ×3 IMPLANT
SUT VIC AB 0 CT1 27 (SUTURE) ×8
SUT VIC AB 0 CT1 27XBRD ANBCTR (SUTURE) ×4 IMPLANT
SUT VIC AB 1 CTB1 27 (SUTURE) ×3 IMPLANT
SUT VIC AB 1 CTX 18 (SUTURE) IMPLANT
SUT VIC AB 2-0 CT1 27 (SUTURE) ×4
SUT VIC AB 2-0 CT1 TAPERPNT 27 (SUTURE) ×2 IMPLANT
SYR CONTROL 10ML LL (SYRINGE) ×3 IMPLANT
SYSTEM AC REPAIR LO-PRO KNTLS (Anchor) ×3 IMPLANT
TOWEL OR 17X24 6PK STRL BLUE (TOWEL DISPOSABLE) ×3 IMPLANT
TOWEL OR 17X26 10 PK STRL BLUE (TOWEL DISPOSABLE) ×3 IMPLANT
YANKAUER SUCT BULB TIP NO VENT (SUCTIONS) ×3 IMPLANT

## 2018-08-09 NOTE — Op Note (Signed)
NAME: Mathew Cantrell, Park BRADY MEDICAL RECORD [id number] ACCOUNT [id number] DATE OF BIRTH:[birth year] FACILITY: [facility] LOCATION: [facility]-PERI[facility] PHYSICIAN:GREGORY Diamantina ProvidenceS. DEAN, MD  OPERATIVE REPORT  DATE OF PROCEDURE:  08/09/2018  PREOPERATIVE DIAGNOSIS:  Right shoulder grade III to grade V acromioclavicular separation.  POSTOPERATIVE DIAGNOSIS:  Right shoulder grade III to grade V acromioclavicular separation.  PROCEDURE:  Right shoulder acromioclavicular separation repair using Arthrex TightRope.  SURGEON:  Cammy CopaGregory Scott Dean, MD  ASSISTANT:  Melvyn NethJustin Clean, RNFA.  INDICATIONS:  The patient is a 42 year old patient with right shoulder injury, now about 245 days old.  He has significant AC separation more than 100%.  He presents now for operative management after explanation of risks and benefits.  PROCEDURE IN DETAIL:  The patient was brought to the operating room where general anesthetic was induced.  Preoperative antibiotics administered.  Timeout was called.  Right shoulder prescrubbed with alcohol and Betadine, allowed to air dry.  Prep with  DuraPrep solution and draped in a sterile manner.  Ioban used to cover the entire operative field.  Timeout was called.  Incision was made from the posterior aspect of the clavicle to just below the coracoid process.  Skin and subcutaneous tissue were  sharply divided.  The fascia over the clavicle was incised and mobilized anteriorly and posteriorly for later repair.  At this time, deltoid was split over the coracoid process bluntly.  Retraction was placed on the medial and lateral aspect of the  coracoid process.  The Arthrex drill guide was then utilized to drill through the central portion clavicle.  Then, with my finger underneath the coracoid process protecting underlying neurovascular structures, the coracoid was drilled.  The TightRope was  then passed and the button was flipped again with care being taken to avoid any injury to underlying neurovascular  structures.  At this time, the clavicle was reduced and the TightRope was fixed.  Good reduction of the coracoclavicular distance was  achieved.  Good reduction of the clavicle also achieved back to its proper position.  The arm was taken through a range of motion.  The construct was found to be stable.  At this time, thorough irrigation was performed.  The deltoid split was closed  using 0 Vicryl suture of the fascia overlying the clavicle.  It was closed using #1 Vicryl suture.  Skin was closed using 0 Vicryl suture, 2-0 Vicryl suture and a 3-0 Monocryl.  Aquacel dressing and shoulder immobilizer placed.  The patient tolerated the  procedure well without immediate complications.  He was transferred to the recovery room in stable condition.  TN/NUANCE  D:08/09/2018 T:08/09/2018 JOB:[id number]

## 2018-08-09 NOTE — Progress Notes (Signed)
Patients BP's on arrival 184/126; 188/133; 180/120. Spoke with Dr. Bradley FerrisEllender, to come evaluate patient.

## 2018-08-09 NOTE — Anesthesia Preprocedure Evaluation (Addendum)
Anesthesia Evaluation  Patient identified by MRN, date of birth, ID band Patient awake    Reviewed: Allergy & Precautions, NPO status , Patient's Chart, lab work & pertinent test results  Airway Mallampati: II  TM Distance: >3 FB Neck ROM: Full    Dental no notable dental hx.    Pulmonary neg pulmonary ROS,    Pulmonary exam normal breath sounds clear to auscultation       Cardiovascular negative cardio ROS Normal cardiovascular exam Rhythm:Regular Rate:Normal     Neuro/Psych negative neurological ROS  negative psych ROS   GI/Hepatic negative GI ROS, Neg liver ROS,   Endo/Other  negative endocrine ROS  Renal/GU negative Renal ROS     Musculoskeletal negative musculoskeletal ROS (+)   Abdominal (+) + obese,   Peds  Hematology negative hematology ROS (+)   Anesthesia Other Findings right acromioclavicular joint separation  Reproductive/Obstetrics                            Anesthesia Physical Anesthesia Plan  ASA: II  Anesthesia Plan: General and Regional   Post-op Pain Management: GA combined w/ Regional for post-op pain   Induction: Intravenous  PONV Risk Score and Plan: 2 and Ondansetron, Dexamethasone, Treatment may vary due to age or medical condition and Midazolam  Airway Management Planned: Oral ETT  Additional Equipment:   Intra-op Plan:   Post-operative Plan: Extubation in OR  Informed Consent: I have reviewed the patients History and Physical, chart, labs and discussed the procedure including the risks, benefits and alternatives for the proposed anesthesia with the patient or authorized representative who has indicated his/her understanding and acceptance.   Dental advisory given  Plan Discussed with: CRNA  Anesthesia Plan Comments:         Anesthesia Quick Evaluation

## 2018-08-09 NOTE — Discharge Instructions (Signed)
Regional Anesthesia Blocks ? ?1. Numbness or the inability to move the "blocked" extremity may last from 3-48 hours after placement. The length of time depends on the medication injected and your individual response to the medication. If the numbness is not going away after 48 hours, call your surgeon. ? ?2. The extremity that is blocked will need to be protected until the numbness is gone and the  Strength has returned. Because you cannot feel it, you will need to take extra care to avoid injury. Because it may be weak, you may have difficulty moving it or using it. You may not know what position it is in without looking at it while the block is in effect. ? ?3. For blocks in the legs and feet, returning to weight bearing and walking needs to be done carefully. You will need to wait until the numbness is entirely gone and the strength has returned. You should be able to move your leg and foot normally before you try and bear weight or walk. You will need someone to be with you when you first try to ensure you do not fall and possibly risk injury. ? ?4. Bruising and tenderness at the needle site are common side effects and will resolve in a few days. ? ?5. Persistent numbness or new problems with movement should be communicated to the surgeon or the [facility] ([phone])/ [facility] ([phone]).  ? ?Post Anesthesia Home Care Instructions ? ?Activity: ?Get plenty of rest for the remainder of the day. A responsible individual must stay with you for 24 hours following the procedure.  ?For the next 24 hours, DO NOT: ?-Drive a car ?-Operate machinery ?-Drink alcoholic beverages ?-Take any medication unless instructed by your physician ?-Make any legal decisions or sign important papers. ? ?Meals: ?Start with liquid foods such as gelatin or soup. Progress to regular foods as tolerated. Avoid greasy, spicy, heavy foods. If nausea and/or vomiting occur, drink only clear liquids until the  nausea and/or vomiting subsides. Call your physician if vomiting continues. ? ?Special Instructions/Symptoms: ?Your throat may feel dry or sore from the anesthesia or the breathing tube placed in your throat during surgery. If this causes discomfort, gargle with warm salt water. The discomfort should disappear within 24 hours. ? ?If you had a scopolamine patch placed behind your ear for the management of post- operative nausea and/or vomiting: ? ?1. The medication in the patch is effective for 72 hours, after which it should be removed.  Wrap patch in a tissue and discard in the trash. Wash hands thoroughly with soap and water. ?2. You may remove the patch earlier than 72 hours if you experience unpleasant side effects which may include dry mouth, dizziness or visual disturbances. ?3. Avoid touching the patch. Wash your hands with soap and water after contact with the patch. ?    ?

## 2018-08-09 NOTE — Brief Op Note (Signed)
08/09/2018  1:45 PM  PATIENT:  Mathew Cantrell  42 y.o. male  PRE-OPERATIVE DIAGNOSIS:  right acromioclavicular joint separation  POST-OPERATIVE DIAGNOSIS:  right acromioclavicular joint separation  PROCEDURE:  Procedure(s): RIGHT ACROMIOCLAVICULAR JOINT REPAIR  SURGEON:  Surgeon(s): August Saucerean, Corrie MckusickGregory Scott, MD  ASSISTANT: Francene FindersJ Queen rnfa  ANESTHESIA:   general  EBL: 25 ml    Total I/O In: 1000 [I.V.:1000] Out: 200 [Blood:200]  BLOOD ADMINISTERED: none  DRAINS: none   LOCAL MEDICATIONS USED:  none  SPECIMEN:  No Specimen  COUNTS:  YES  TOURNIQUET:  * No tourniquets in log *  DICTATION: .Other Dictation: Dictation Number [id number]  PLAN OF CARE: Discharge to home after PACU  PATIENT DISPOSITION:  PACU - hemodynamically stable

## 2018-08-09 NOTE — Anesthesia Postprocedure Evaluation (Signed)
Anesthesia Post Note  Patient: Mathew Cantrell  Procedure(s) Performed: RIGHT ACROMIOCLAVICULAR JOINT REPAIR (Right Shoulder)     Patient location during evaluation: PACU Anesthesia Type: Regional and General Level of consciousness: awake and alert Pain management: pain level controlled Vital Signs Assessment: post-procedure vital signs reviewed and stable Respiratory status: spontaneous breathing, nonlabored ventilation, respiratory function stable and patient connected to nasal cannula oxygen Cardiovascular status: blood pressure returned to baseline and stable Postop Assessment: no apparent nausea or vomiting Anesthetic complications: no    Last Vitals:  Vitals:   08/09/18 1420 08/09/18 1434  BP:  (!) 146/94  Pulse:  98  Resp:  18  Temp: 36.5 C   SpO2:  92%    Last Pain:  Vitals:   08/09/18 1434  TempSrc:   PainSc: 0-No pain                 Andreea Arca P Janetta Vandoren

## 2018-08-09 NOTE — Anesthesia Procedure Notes (Addendum)
Anesthesia Regional Block: Interscalene brachial plexus block (and superficial cervical plexus)   Pre-Anesthetic Checklist: ,, timeout performed, Correct Patient, Correct Site, Correct Laterality, Correct Procedure, Correct Position, site marked, Risks and benefits discussed,  Surgical consent,  Pre-op evaluation,  At surgeon's request and post-op pain management  Laterality: Right  Prep: chloraprep       Needles:  Injection technique: Single-shot  Needle Type: Echogenic Stimulator Needle     Needle Length: 9cm  Needle Gauge: 21     Additional Needles:   Procedures:,,,, ultrasound used (permanent image in chart),,,,  Narrative:  Start time: 08/09/2018 10:30 AM End time: 08/09/2018 10:40 AM Injection made incrementally with aspirations every 5 mL.  Performed by: Personally  Anesthesiologist: Leonides GrillsEllender, Ryan P, MD  Additional Notes: Functioning IV was confirmed and monitors were applied.  A timeout was performed. Sterile prep, hand hygiene and sterile gloves were used. A 90mm 21ga Arrow echogenic stimulator needle was used. Negative aspiration and negative test dose prior to incremental administration of local anesthetic. The patient tolerated the procedure well.  Ultrasound guidance: relevent anatomy identified, needle position confirmed, local anesthetic spread visualized around nerve(s), vascular puncture avoided.  Image printed for medical record.

## 2018-08-09 NOTE — H&P (Signed)
Mathew DandyJames Brady Cantrell is an 42 y.o. male.   Chief Complaint: Right shoulder pain HPI: Mathew Cantrell is a patient with right shoulder pain.  He describes right shoulder injury several days ago.  Radiographs demonstrate at least grade 3 possible grade 5 AC separation.  He works as a Emergency planning/management officerpolice officer.  He likes to do physical activity in his spare time.  He denies any prior surgery on that shoulder.  No personal or family history of DVT or pulmonary embolism he does report having potentially partial-thickness cuff problem years ago which resolved.  Past Medical History:  Diagnosis Date  . Medical history non-contributory     Past Surgical History:  Procedure Laterality Date  . KNEE SURGERY    . TONSILLECTOMY      History reviewed. No pertinent family history. Social History:  reports that he has never smoked. He has never used smokeless tobacco. He reports current alcohol use. He reports that he does not use drugs.  Allergies: No Known Allergies  Medications Prior to Admission  Medication Sig Dispense Refill  . acetaminophen (TYLENOL) 500 MG tablet Take 2 tablets (1,000 mg total) by mouth every 8 (eight) hours for 5 days. Do not take more than 4000 mg of acetaminophen (Tylenol) in a 24-hour period. Please note that other medicines that you may be prescribed may have Tylenol as well. 30 tablet 0  . diazepam (VALIUM) 10 MG tablet Take 1 tablet (10 mg total) by mouth every 8 (eight) hours as needed for muscle spasms. 20 tablet 0  . HYDROcodone-acetaminophen (NORCO/VICODIN) 5-325 MG tablet Take 1 tablet by mouth every 8 (eight) hours as needed for up to 5 days for severe pain (That is not improved by your scheduled acetaminophen regimen). Please do not exceed 4000 mg of acetaminophen (Tylenol) a 24-hour period. Please note that he may be prescribed additional medicine that contains acetaminophen. 15 tablet 0  . ibuprofen (ADVIL,MOTRIN) 200 MG tablet Take 400 mg by mouth 2 (two) times daily as needed (pain).     Marland Kitchen. tiZANidine (ZANAFLEX) 4 MG capsule Take 4 mg by mouth 3 (three) times daily as needed for muscle spasms.       No results found for this or any previous visit (from the past 48 hour(s)). No results found.  Review of Systems  Musculoskeletal: Positive for joint pain.  All other systems reviewed and are negative.   Blood pressure (!) 200/124, pulse 89, temperature 97.6 F (36.4 C), temperature source Oral, resp. rate (!) 22, height 6\' 4"  (1.93 m), weight 115.7 kg, SpO2 100 %. Physical Exam  Constitutional: He appears well-developed.  HENT:  Head: Normocephalic.  Eyes: Pupils are equal, round, and reactive to light.  Neck: Normal range of motion.  Cardiovascular: Normal rate.  Respiratory: Effort normal.  Neurological: He is alert.  Skin: Skin is warm.  Psychiatric: He has a normal mood and affect.  Examination of the right shoulder demonstrates elevation of the distal clavicle in relation to the acromion.  Deltoid is functional.  Rotator cuff strength difficult to assess due to pain.  Motor or sensory function to the hand is intact  Assessment/Plan Impression is grade 3 to grade 5 AC joint separation.  Plan is coracoclavicular tight rope fixation.  Do not anticipate resecting the distal clavicle.  Risk benefits of the surgery are discussed with the patient including but limited to infection nerve vessel damage incomplete healing and shoulder stiffness.  Patient understands the risk and benefits and wishes to proceed.  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 Burnard Bunting, MD 08/09/2018, 10:58 AM

## 2018-08-09 NOTE — Anesthesia Procedure Notes (Signed)
Procedure Name: Intubation Performed by: Ezekiel InaBotts, Fleet H, CRNA Pre-anesthesia Checklist: Patient identified, Emergency Drugs available, Suction available and Patient being monitored Patient Re-evaluated:Patient Re-evaluated prior to induction Oxygen Delivery Method: Circle System Utilized Preoxygenation: Pre-oxygenation with 100% oxygen Induction Type: IV induction Ventilation: Mask ventilation with difficulty, Two handed mask ventilation required and Oral airway inserted - appropriate to patient size Laryngoscope Size: Miller and 3 Grade View: Grade II Tube type: Oral Tube size: 7.5 mm Number of attempts: 1 Airway Equipment and Method: Stylet and Oral airway Placement Confirmation: ETT inserted through vocal cords under direct vision,  positive ETCO2 and breath sounds checked- equal and bilateral Secured at: 24 cm Tube secured with: Tape Dental Injury: Teeth and Oropharynx as per pre-operative assessment

## 2018-08-09 NOTE — Transfer of Care (Signed)
Immediate Anesthesia Transfer of Care Note  Patient: Mathew Cantrell  Procedure(s) Performed: RIGHT ACROMIOCLAVICULAR JOINT REPAIR (Right Shoulder)  Patient Location: PACU  Anesthesia Type:GA combined with regional for post-op pain  Level of Consciousness: awake, alert  and oriented  Airway & Oxygen Therapy: Patient Spontanous Breathing and Patient connected to face mask oxygen  Post-op Assessment: Report given to RN and Post -op Vital signs reviewed and stable  Post vital signs: Reviewed and stable  Last Vitals:  Vitals Value Taken Time  BP 150/101 08/09/2018  1:54 PM  Temp    Pulse 113 08/09/2018  1:56 PM  Resp 18 08/09/2018  1:56 PM  SpO2 92 % 08/09/2018  1:56 PM  Vitals shown include unvalidated device data.  Last Pain:  Vitals:   08/09/18 0922  TempSrc:   PainSc: 6       Patients Stated Pain Goal: 0 (08/09/18 [id number])  Complications: No apparent anesthesia complications

## 2018-08-09 NOTE — Progress Notes (Signed)
Dr. Bradley FerrisEllender came to evaluate patient. No labs ordered due to no medical history.

## 2018-08-13 ENCOUNTER — Encounter (HOSPITAL_COMMUNITY): Payer: Self-pay | Admitting: Orthopedic Surgery

## 2018-08-15 ENCOUNTER — Ambulatory Visit (INDEPENDENT_AMBULATORY_CARE_PROVIDER_SITE_OTHER): Payer: BC Managed Care – PPO | Admitting: Orthopedic Surgery

## 2018-08-15 ENCOUNTER — Ambulatory Visit (INDEPENDENT_AMBULATORY_CARE_PROVIDER_SITE_OTHER): Payer: BC Managed Care – PPO

## 2018-08-15 ENCOUNTER — Encounter (INDEPENDENT_AMBULATORY_CARE_PROVIDER_SITE_OTHER): Payer: Self-pay | Admitting: Orthopedic Surgery

## 2018-08-15 DIAGNOSIS — M25512 Pain in left shoulder: Secondary | ICD-10-CM | POA: Diagnosis not present

## 2018-08-15 DIAGNOSIS — G8929 Other chronic pain: Secondary | ICD-10-CM

## 2018-08-15 DIAGNOSIS — S43101A Unspecified dislocation of right acromioclavicular joint, initial encounter: Secondary | ICD-10-CM | POA: Diagnosis not present

## 2018-08-15 MED ORDER — METHOCARBAMOL 500 MG PO TABS: 500.0000 mg | ORAL_TABLET | Freq: Three times a day (TID) | ORAL | 0 refills | Status: DC | PRN

## 2018-08-15 MED ORDER — OXYCODONE-ACETAMINOPHEN 5-325 MG PO TABS: ORAL_TABLET | ORAL | 0 refills | Status: DC

## 2018-08-21 ENCOUNTER — Encounter (INDEPENDENT_AMBULATORY_CARE_PROVIDER_SITE_OTHER): Payer: Self-pay | Admitting: Orthopedic Surgery

## 2018-08-21 NOTE — Progress Notes (Signed)
   Post-Op Visit Note   Patient: Mathew Cantrell           Date of Birth: 1976-07-16           MRN: [id number] Visit Date: 08/15/2018 PCP: Patient, No Pcp Per   Assessment & Plan:  Chief Complaint:  Chief Complaint  Patient presents with  . Right Shoulder - Routine Post Op, Follow-up   Visit Diagnoses:  1. AC separation, type 3, right, initial encounter   2. Chronic left shoulder pain     Plan: [name] is a patient is now [facility].  He has been doing reasonably well.  On exam motor sensory function to the hand is intact.  AC joint has been reduced on plain radiographs.  I will let him start doing some pendulum exercises but refill oxycodone and Robaxin.  3-week return for clinical recheck at that time.  Follow-Up Instructions: Return in about 3 weeks (around 09/05/2018).   Orders:  Orders Placed This Encounter  Procedures  . XR Shoulder Right  . XR Shoulder Left   Meds ordered this encounter  Medications  . oxyCODONE-acetaminophen (PERCOCET/ROXICET) 5-325 MG tablet    Sig: TAKE 1 TAB PO Q 6-8 HOURS PRN PAIN    Dispense:  40 tablet    Refill:  0  . methocarbamol (ROBAXIN) 500 MG tablet    Sig: Take 1 tablet (500 mg total) by mouth every 8 (eight) hours as needed for muscle spasms.    Dispense:  30 tablet    Refill:  0    Imaging: No results found.  PMFS History: There are no active problems to display for this patient.  Past Medical History:  Diagnosis Date  . Medical history non-contributory     History reviewed. No pertinent family history.  Past Surgical History:  Procedure Laterality Date  . ACROMIO-CLAVICULAR JOINT REPAIR Right 08/09/2018   Procedure: RIGHT ACROMIOCLAVICULAR JOINT REPAIR;  Surgeon: Cammy Copaean, Gregory Scott, MD;  [facility];  Service: Orthopedics;  Laterality: Right;  . KNEE SURGERY    . TONSILLECTOMY     Social History   Occupational History  . Not on file  Tobacco Use  . Smoking status: Never Smoker  .  Smokeless tobacco: Never Used  Substance and Sexual Activity  . Alcohol use: Yes    Comment: wine every few weeks  . Drug use: Never  . Sexual activity: Not on file

## 2018-08-28 DIAGNOSIS — S43101A Unspecified dislocation of right acromioclavicular joint, initial encounter: Secondary | ICD-10-CM

## 2018-08-31 ENCOUNTER — Other Ambulatory Visit (INDEPENDENT_AMBULATORY_CARE_PROVIDER_SITE_OTHER): Payer: Self-pay | Admitting: Orthopedic Surgery

## 2018-08-31 NOTE — Telephone Encounter (Signed)
Please advise. OK for refill? 

## 2018-09-01 NOTE — Telephone Encounter (Signed)
Ok for norco 5 325 1 po q 8 # 35

## 2018-09-03 MED ORDER — HYDROCODONE-ACETAMINOPHEN 5-325 MG PO TABS: 1.0000 | ORAL_TABLET | Freq: Three times a day (TID) | ORAL | 0 refills | Status: DC | PRN

## 2018-09-03 NOTE — Telephone Encounter (Signed)
Script printed. I called patient and advised he can pick up rx at front desk.

## 2018-09-05 ENCOUNTER — Encounter (INDEPENDENT_AMBULATORY_CARE_PROVIDER_SITE_OTHER): Payer: Self-pay | Admitting: Orthopedic Surgery

## 2018-09-05 ENCOUNTER — Ambulatory Visit (INDEPENDENT_AMBULATORY_CARE_PROVIDER_SITE_OTHER): Payer: BC Managed Care – PPO | Admitting: Orthopedic Surgery

## 2018-09-05 DIAGNOSIS — S43101A Unspecified dislocation of right acromioclavicular joint, initial encounter: Secondary | ICD-10-CM

## 2018-09-05 NOTE — Progress Notes (Signed)
   Post-Op Visit Note   Patient: Mathew Cantrell           Date of Birth: Sep 07, 1975           MRN: [id number] Visit Date: 09/05/2018 PCP: Patient, No Pcp Per   Assessment & Plan:  Chief Complaint:  Chief Complaint  Patient presents with  . Right Shoulder - Pain   Visit Diagnoses:  1. AC separation, type 3, right, initial encounter     Plan: Mathew Cantrell is a patient with right AC joint repair 08/09/2018.  Been doing pendulum exercises.  He is about a month out.  On exam he is got pretty reasonable passive range of motion.  Has some occasional clicking but it may be more coming from the shoulder joint itself as opposed to the [facility] joint.  He had refill on Norco down from Percocet on Monday.  I will start him in physical therapy and have him DC the sling.  No lifting of anything with that right arm for 2 weeks.  Then he can lift things that are 5 pounds or less.  In therapy I do not really want him going above 90 degrees of abduction or 90 degrees of forward flexion for the first 2 weeks.  Then he can do that motion and add isometric strengthening.  Come back in 4 weeks for clinical recheck.  Did send a copy of the op note with Fayrene Fearing for his therapy.  Follow-Up Instructions: Return in about 4 weeks (around 10/03/2018).   Orders:  No orders of the defined types were placed in this encounter.  No orders of the defined types were placed in this encounter.   Imaging: No results found.  PMFS History: Patient Active Problem List   Diagnosis Date Noted  . AC separation, type 3, right, initial encounter    Past Medical History:  Diagnosis Date  . Medical history non-contributory     History reviewed. No pertinent family history.  Past Surgical History:  Procedure Laterality Date  . ACROMIO-CLAVICULAR JOINT REPAIR Right 08/09/2018   Procedure: RIGHT ACROMIOCLAVICULAR JOINT REPAIR;  Surgeon: [name], MD;  Location: [facility] OR;  Service: Orthopedics;  Laterality: Right;  . KNEE  SURGERY    . TONSILLECTOMY     Social History   Occupational History  . Not on file  Tobacco Use  . Smoking status: Never Smoker  . Smokeless tobacco: Never Used  Substance and Sexual Activity  . Alcohol use: Yes    Comment: wine every few weeks  . Drug use: Never  . Sexual activity: Not on file

## 2018-10-05 ENCOUNTER — Ambulatory Visit (INDEPENDENT_AMBULATORY_CARE_PROVIDER_SITE_OTHER): Payer: BC Managed Care – PPO | Admitting: Orthopedic Surgery

## 2018-10-05 ENCOUNTER — Encounter (INDEPENDENT_AMBULATORY_CARE_PROVIDER_SITE_OTHER): Payer: Self-pay | Admitting: Orthopedic Surgery

## 2018-10-05 DIAGNOSIS — S43101A Unspecified dislocation of right acromioclavicular joint, initial encounter: Secondary | ICD-10-CM

## 2018-10-05 NOTE — Progress Notes (Signed)
   Post-Op Visit Note   Patient: Mathew Cantrell           Date of Birth: 11/01/1975           MRN: [id number] Visit Date: 10/05/2018 PCP: Patient, No Pcp Per   Assessment & Plan:  Chief Complaint:  Chief Complaint  Patient presents with  . Right Shoulder - Follow-up   Visit Diagnoses: No diagnosis found.  Plan: Rutherford is a patient who is now about 8 weeks out right [facility] joint reconstruction.  Doing well.  On exam he has improving shoulder motion and strength.  I want him to continue therapy for the next month.  I will see him back in about 5 weeks and we could see about returning to regular duty around the 69-month mark.  Currently he is doing half days and detective type work.  Follow-Up Instructions: Return in about 5 weeks (around 11/09/2018).   Orders:  No orders of the defined types were placed in this encounter.  No orders of the defined types were placed in this encounter.   Imaging: No results found.  PMFS History: Patient Active Problem List   Diagnosis Date Noted  . AC separation, type 3, right, initial encounter    Past Medical History:  Diagnosis Date  . Medical history non-contributory     No family history on file.  Past Surgical History:  Procedure Laterality Date  . ACROMIO-CLAVICULAR JOINT REPAIR Right 08/09/2018   Procedure: RIGHT ACROMIOCLAVICULAR JOINT REPAIR;  Surgeon: [name], MD;  Location: [facility] OR;  Service: Orthopedics;  Laterality: Right;  . KNEE SURGERY    . TONSILLECTOMY     Social History   Occupational History  . Not on file  Tobacco Use  . Smoking status: Never Smoker  . Smokeless tobacco: Never Used  Substance and Sexual Activity  . Alcohol use: Yes    Comment: wine every few weeks  . Drug use: Never  . Sexual activity: Not on file

## 2018-11-14 ENCOUNTER — Encounter (INDEPENDENT_AMBULATORY_CARE_PROVIDER_SITE_OTHER): Payer: Self-pay | Admitting: Orthopedic Surgery

## 2018-11-14 ENCOUNTER — Other Ambulatory Visit: Payer: Self-pay

## 2018-11-14 ENCOUNTER — Ambulatory Visit (INDEPENDENT_AMBULATORY_CARE_PROVIDER_SITE_OTHER): Payer: BC Managed Care – PPO | Admitting: Orthopedic Surgery

## 2018-11-14 ENCOUNTER — Ambulatory Visit (INDEPENDENT_AMBULATORY_CARE_PROVIDER_SITE_OTHER): Payer: BC Managed Care – PPO

## 2018-11-14 DIAGNOSIS — S43101A Unspecified dislocation of right acromioclavicular joint, initial encounter: Secondary | ICD-10-CM

## 2018-11-14 NOTE — Progress Notes (Signed)
   Post-Op Visit Note   Patient: Mathew Cantrell           Date of Birth: 1975-09-29           MRN: [id number] Visit Date: 11/14/2018 PCP: Patient, No Pcp Per   Assessment & Plan:  Chief Complaint:  Chief Complaint  Patient presents with  . Right Shoulder - Follow-up   Visit Diagnoses:  1. AC separation, type 3, right, initial encounter     Plan: Mathew Cantrell is a patient who is now about 3 months out right [facility] joint reconstruction.  He is doing well.  He is in therapy 2 times a week doing elevated push-ups.  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  He is getting stronger and his range of motion is improving.  On examination he has excellent range of motion and improving strength.  Clavicles look symmetric.  Still has little numbness around the incision which is not unexpected.  Radiographs look good.  I want him to continue his current duty for 6 weeks and I will see him back in 6 weeks.  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  See him back at that time for final check.  Follow-Up Instructions: Return in about 6 weeks (around 12/26/2018).   Orders:  Orders Placed This Encounter  Procedures  . XR AC Joints   No orders of the defined types were placed in this encounter.   Imaging: Xr Ac Joints  Result Date: 11/14/2018 AP bilateral AC joints standing reviewed.  Acromiohumeral distance symmetric bilaterally.  Coracoclavicular distance also intact and symmetric bilaterally.  On the right side the hardware for coracoclavicular ligament fixation is intact without complication.   PMFS History: Patient Active Problem List   Diagnosis Date Noted  . AC separation, type 3, right, initial encounter    Past Medical History:  Diagnosis Date  . Medical history non-contributory     History reviewed. No pertinent  family history.  Past Surgical History:  Procedure Laterality Date  . ACROMIO-CLAVICULAR JOINT REPAIR Right 08/09/2018   Procedure: RIGHT ACROMIOCLAVICULAR JOINT REPAIR;  Surgeon: [name], MD;  Location: [facility] OR;  Service: Orthopedics;  Laterality: Right;  . KNEE SURGERY    . TONSILLECTOMY     Social History   Occupational History  . Not on file  Tobacco Use  . Smoking status: Never Smoker  . Smokeless tobacco: Never Used  Substance and Sexual Activity  . Alcohol use: Yes    Comment: wine every few weeks  . Drug use: Never  . Sexual activity: Not on file

## 2018-12-26 ENCOUNTER — Other Ambulatory Visit: Payer: Self-pay

## 2018-12-26 ENCOUNTER — Encounter (INDEPENDENT_AMBULATORY_CARE_PROVIDER_SITE_OTHER): Payer: Self-pay | Admitting: Orthopedic Surgery

## 2018-12-26 ENCOUNTER — Ambulatory Visit (INDEPENDENT_AMBULATORY_CARE_PROVIDER_SITE_OTHER): Payer: BC Managed Care – PPO | Admitting: Orthopedic Surgery

## 2018-12-26 DIAGNOSIS — S43101A Unspecified dislocation of right acromioclavicular joint, initial encounter: Secondary | ICD-10-CM | POA: Diagnosis not present

## 2018-12-26 NOTE — Progress Notes (Signed)
   Office Visit Note   Patient: Mathew Cantrell           Date of Birth: 1976/07/16           MRN: [id number] Visit Date: 12/26/2018 Requested by: No referring provider defined for this encounter. PCP: Patient, No Pcp Per  Subjective: Chief Complaint  Patient presents with  . Right Shoulder - Follow-up    HPI: Changes the patient is about 5 months out right [facility] joint reconstruction and repair.  He has been doing well.  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  He has been doing some weightlifting and feels like he is about only 10 or 15% off of his strength.  He is able to sleep on the right-hand side              ROS: All systems reviewed are negative as they relate to the chief complaint within the history of present illness.  Patient denies  fevers or chills.   Assessment & Plan: Visit Diagnoses:  1. AC separation, type 3, right, initial encounter     Plan: Impression is right AC joint repair 08/09/2018 5 months out doing well.  He has excellent range of motion and strength.  I am going to release him at this time.  I do want him to be careful lifting heavy weights.  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  Follow-up with me as needed.  Follow-Up Instructions: Return if symptoms worsen or fail to improve.   Orders:  No orders of the defined types were placed in this encounter.  No orders of the defined types were placed in this encounter.     Procedures: No procedures performed   Clinical Data: No additional findings.  Objective: Vital Signs: There were no vitals taken for this visit.  Physical Exam:   Constitutional: Patient appears well-developed HEENT:  Head: Normocephalic Eyes:EOM are normal Neck: Normal range of motion Cardiovascular: Normal rate Pulmonary/chest: Effort normal Neurologic: Patient is alert Skin: Skin is warm Psychiatric: Patient has normal mood and affect    Ortho  Exam: Ortho exam demonstrates full active and passive range of motion of that right shoulder.  Incision is well-healed.  Cuff strength is good and deltoid strength is also very good.  Not much in the way of excessive coarseness or grinding in that right shoulder with active or passive range of motion.  Specialty Comments:  No specialty comments available.  Imaging: No results found.   PMFS History: Patient Active Problem List   Diagnosis Date Noted  . AC separation, type 3, right, initial encounter    Past Medical History:  Diagnosis Date  . Medical history non-contributory     History reviewed. No pertinent family history.  Past Surgical History:  Procedure Laterality Date  . ACROMIO-CLAVICULAR JOINT REPAIR Right 08/09/2018   Procedure: RIGHT ACROMIOCLAVICULAR JOINT REPAIR;  Surgeon: [name], MD;  Location: [place] OR;  Service: Orthopedics;  Laterality: Right;  . KNEE SURGERY    . TONSILLECTOMY     Social History   Occupational History  . Not on file  Tobacco Use  . Smoking status: Never Smoker  . Smokeless tobacco: Never Used  Substance and Sexual Activity  . Alcohol use: Yes    Comment: wine every few weeks  . Drug use: Never  . Sexual activity: Not on file

## 2020-10-29 IMAGING — CR DG LUMBAR SPINE COMPLETE 4+V
5 series · 5 of 5 positions shown · non-contrast
Comparison: 12/05/2007.

CLINICAL DATA: 41-year-old male with low back pain for the past
month. Initial encounter.

EXAM:
LUMBAR SPINE - COMPLETE 4+ VIEW

[t l-spine a.p.]
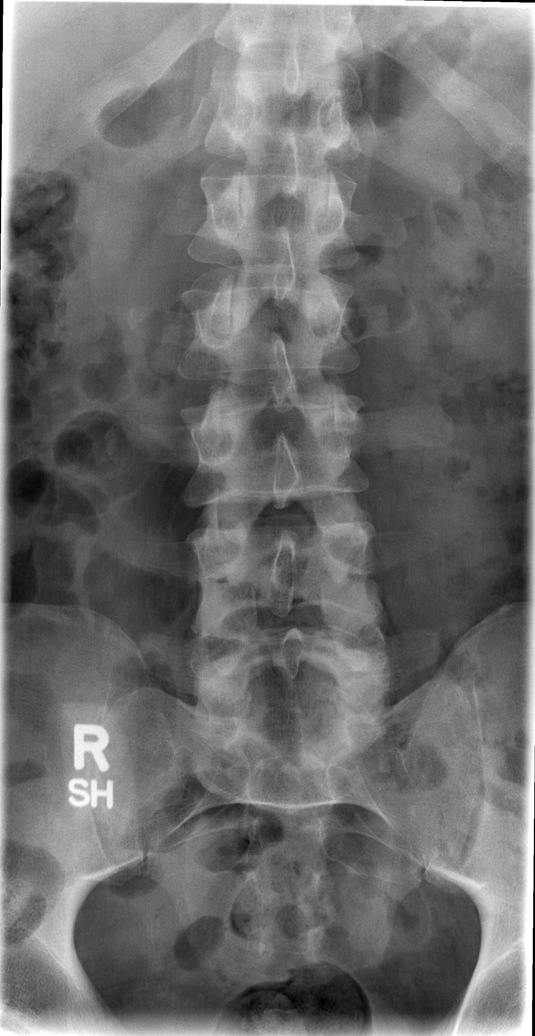

[t l-spine oblique exposure (1 of 2)]
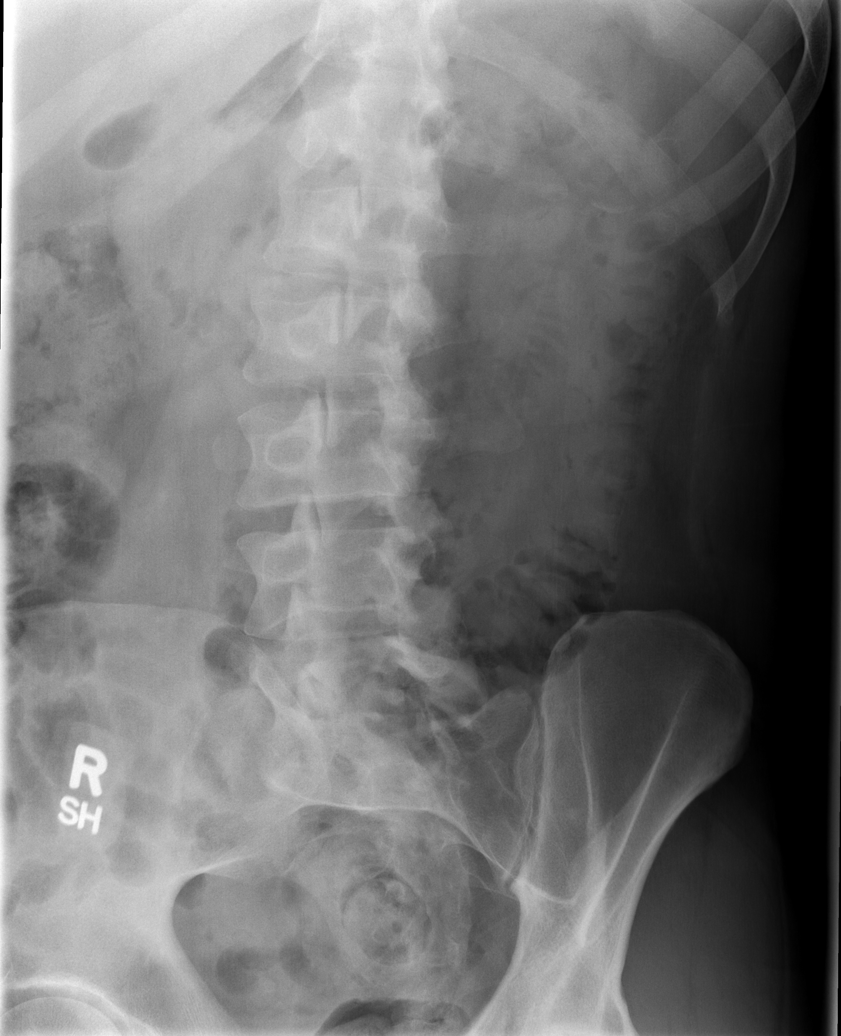

[t l-spine oblique exposure (2 of 2)]
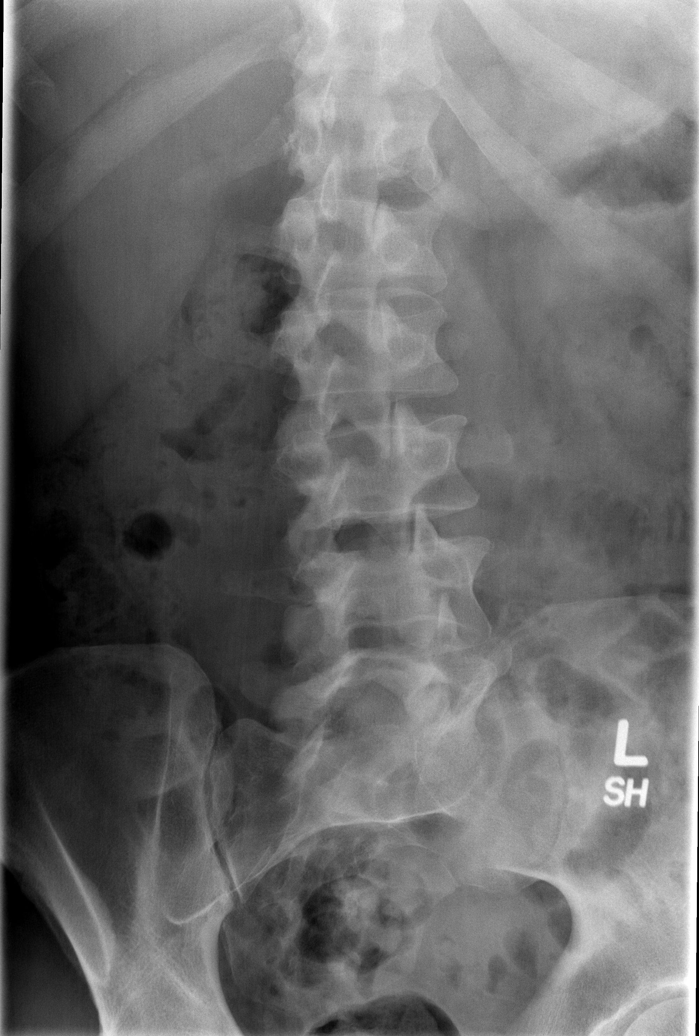

[t l-spine lat]
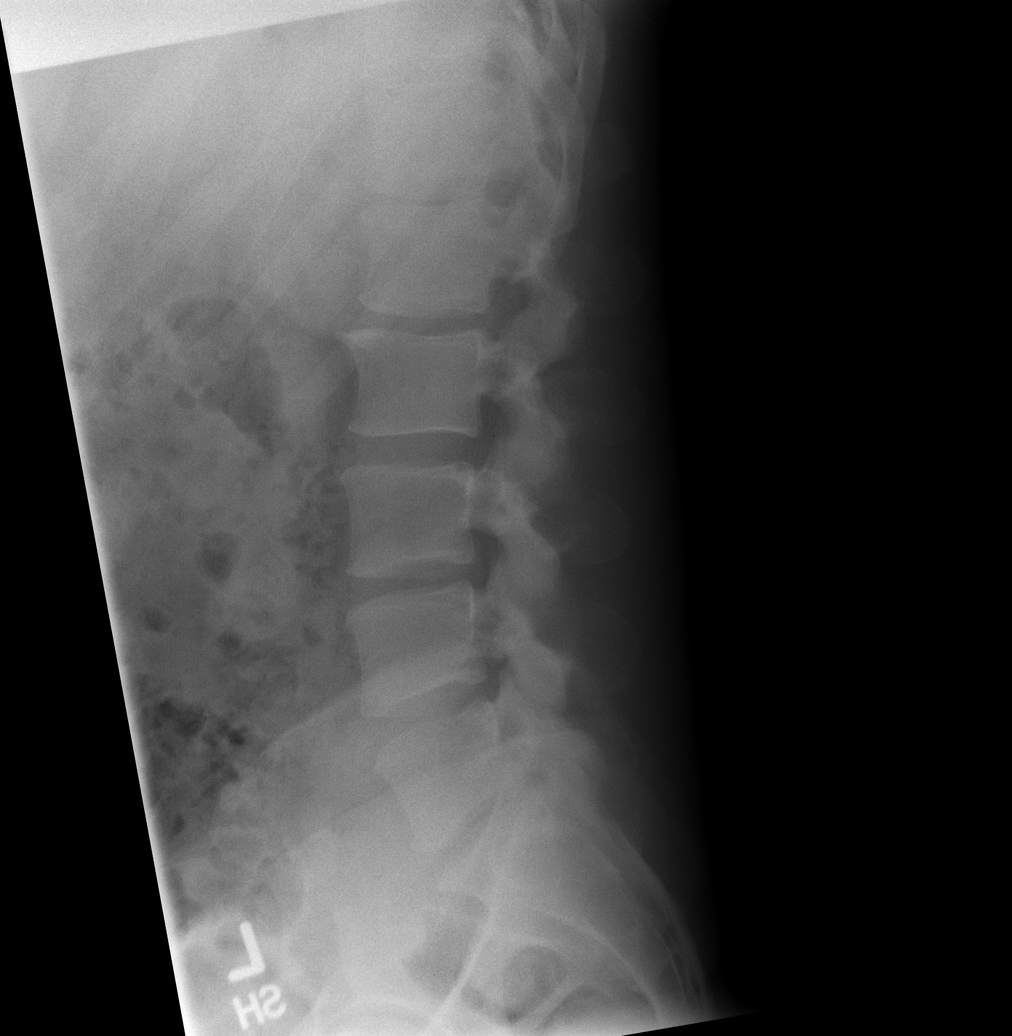

[t l-spine l5-s1 spot]
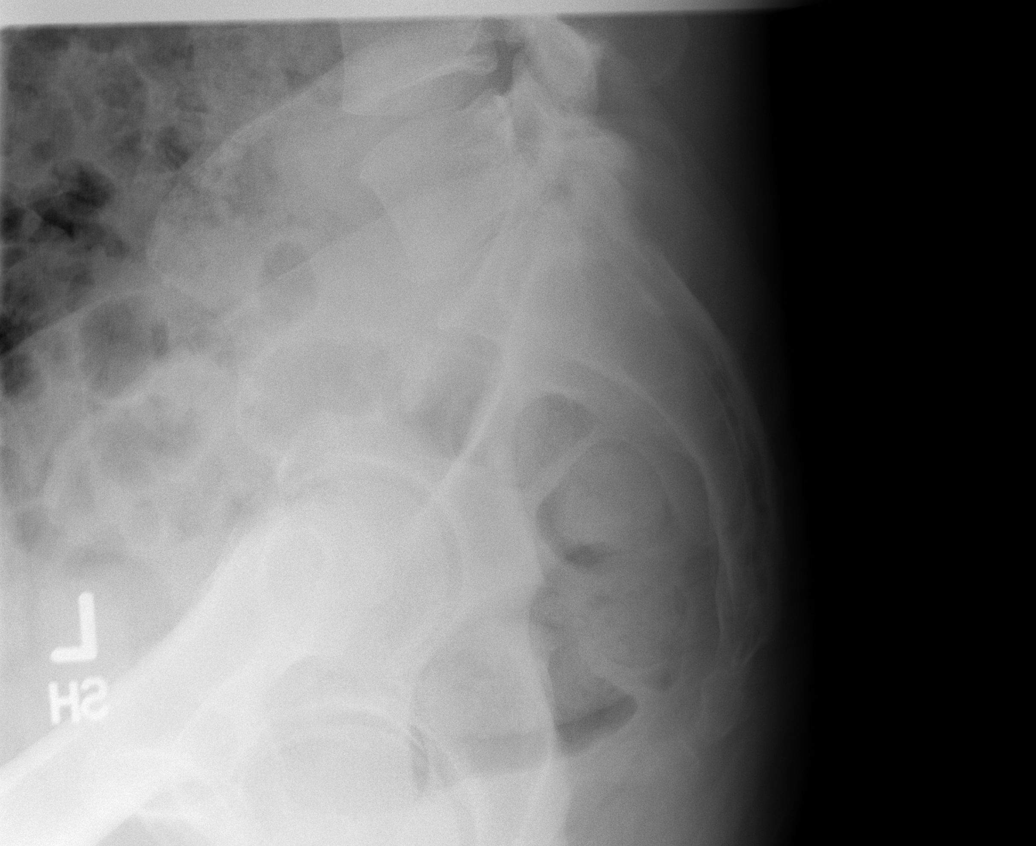

[5 of 5 positions shown; findings below may reference images not displayed]

FINDINGS: Slight curvature lumbar spine convex right. No compression fracture
or pars defect. Mild L1-2 and L5-S1 disc space narrowing. Minimal
Schmorl's node deformity L4-5. Sacroiliac joints appear intact.
IMPRESSION: 1. Slight curvature lumbar spine convex right.
2. No compression fracture or pars defect.
3. Mild L1-2 and L5-S1 disc space narrowing.
4. Minimal Schmorl's node deformity L4-5.

## 2022-03-17 ENCOUNTER — Ambulatory Visit: Payer: BC Managed Care – PPO | Admitting: Orthopedic Surgery

## 2022-03-17 ENCOUNTER — Encounter: Payer: Self-pay | Admitting: Orthopedic Surgery

## 2022-03-17 ENCOUNTER — Ambulatory Visit (INDEPENDENT_AMBULATORY_CARE_PROVIDER_SITE_OTHER): Payer: BC Managed Care – PPO

## 2022-03-17 ENCOUNTER — Telehealth: Payer: Self-pay | Admitting: Orthopedic Surgery

## 2022-03-17 DIAGNOSIS — M25511 Pain in right shoulder: Secondary | ICD-10-CM

## 2022-03-17 NOTE — Telephone Encounter (Signed)
Mathew Cantrell is just following up to clarify if anti-inflammatory meds are being prescribed? Please advise   Please call his wife Mathew Cantrell @ [phone]

## 2022-03-18 MED ORDER — DICLOFENAC SODIUM 75 MG PO TBEC: DELAYED_RELEASE_TABLET | ORAL | 0 refills | Status: DC

## 2022-03-18 NOTE — Telephone Encounter (Signed)
Submitted this morning

## 2022-03-19 ENCOUNTER — Encounter: Payer: Self-pay | Admitting: Orthopedic Surgery

## 2022-03-19 NOTE — Progress Notes (Signed)
Office Visit Note   Patient: Mathew Cantrell           Date of Birth: 02-May-1976           MRN: [id number] Visit Date: 03/17/2022 Requested by: No referring provider defined for this encounter. PCP: Patient, No Pcp Per  Subjective: Chief Complaint  Patient presents with   Right Shoulder - Pain    HPI: Bulmaro Feagans is a 46 y.o. male who presents to the office complaining of right shoulder pain.  Patient has a history of right shoulder acromioclavicular joint reconstruction on 08/09/2018 with good recovery from that procedure.  He has had no issues with his shoulder for the most part since that recovery until last Monday about 10 days ago when he noticed increasing soreness in the shoulder after a chest and back workout.  Did not notice any event during the workout.  He states he has soreness in the anterior lateral aspect of the right shoulder with radiation to the bicep.  He has occasional scapular and trapezius pain as well.  The pain is better in the morning and he is able to sleep on his right shoulder.  Does not wake with pain at all.  Denies any weakness or numbness/tingling.  Most of his pain is with AB ducting his right shoulder.  He does have history of prior pectoralis tendon tear on the right that was treated nonoperatively with physical therapy and this has given him no difficulties in the years since it happened around 2016 or 2017.  He is a former Insurance account manager.  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  He has not taken any medications for pain.  No pain in the neck.  He has taken a break from working out.  Does note occasional clicking..                ROS: All systems reviewed are negative as they relate to the chief complaint within the history of present illness.  Patient denies fevers or chills.  Assessment & Plan: Visit Diagnoses:  1. Right shoulder pain, unspecified chronicity     Plan: Patient is a  46 year old male who presents for evaluation of right shoulder pain.  He has history of right shoulder AC joint reconstruction which he did well from; this was performed in 2019.  He has had shoulder pain over the last 10 days since a workout.  No rotator cuff weakness or crepitus noted on exam today.  Radiographs are negative for any acute changes.  Ultrasound examination demonstrates no definitive rotator cuff tendon tear at this time.  After discussion of options, plan to try oral Voltaren to take twice daily for 2 weeks.  He will take a rest from any shoulder exercises.  Call back to the office in 3 weeks with update on how he is feeling; if no significant improvement plan to order MRI arthrogram of the right shoulder for further evaluation of rotator cuff versus labral pathology.  Patient agreed with plan.  Follow-Up Instructions: No follow-ups on file.   Orders:  Orders Placed This Encounter  Procedures   XR Shoulder Right   Meds ordered this encounter  Medications   diclofenac (VOLTAREN) 75 MG EC tablet    Sig: 1 po bid x 2 wks    Dispense:  30 tablet    Refill:  0      Procedures: No procedures performed   Clinical  Data: No additional findings.  Objective: Vital Signs: There were no vitals taken for this visit.  Physical Exam:  Constitutional: Patient appears well-developed HEENT:  Head: Normocephalic Eyes:EOM are normal Neck: Normal range of motion Cardiovascular: Normal rate Pulmonary/chest: Effort normal Neurologic: Patient is alert Skin: Skin is warm Psychiatric: Patient has normal mood and affect  Ortho Exam: Ortho exam demonstrates right shoulder with 75 degrees external rotation, 100 degrees abduction, 170 degrees forward flexion.  This compared with the left shoulder with 60 degrees external rotation, 110 degrees abduction, 180 degrees forward flexion.  No pain with resisted supination.  5/5 motor strength of bilateral grip strength, finger abduction,  pronation/supination, bicep, tricep, deltoid.  Axillary nerve is intact with deltoid firing.  Excellent rotator cuff strength of supra, infra, subscap on the right shoulder rated 5/5.  Does have some shaking of the arm with supraspinatus strength testing and reproduction of pain.  Negative Hawkins sign.  Negative Neer sign.  No tenderness over the bicipital groove.  No tenderness over the [facility] joint.  There is no coarse grinding or crepitus noted with passive motion of the shoulder.  Negative Spurling sign.  Negative Lhermitte sign.  Specialty Comments:  No specialty comments available.  Imaging: No results found.   PMFS History: Patient Active Problem List   Diagnosis Date Noted   AC separation, type 3, right, initial encounter    Past Medical History:  Diagnosis Date   Medical history non-contributory     History reviewed. No pertinent family history.  Past Surgical History:  Procedure Laterality Date   ACROMIO-CLAVICULAR JOINT REPAIR Right 08/09/2018   Procedure: RIGHT ACROMIOCLAVICULAR JOINT REPAIR;  Surgeon: [name], MD;  Location: [facility] OR;  Service: Orthopedics;  Laterality: Right;   KNEE SURGERY     TONSILLECTOMY     Social History   Occupational History   Not on file  Tobacco Use   Smoking status: Never   Smokeless tobacco: Never  Vaping Use   Vaping Use: Never used  Substance and Sexual Activity   Alcohol use: Yes    Comment: wine every few weeks   Drug use: Never   Sexual activity: Not on file

## 2022-04-28 ENCOUNTER — Encounter: Payer: Self-pay | Admitting: Orthopedic Surgery

## 2022-04-28 DIAGNOSIS — M25511 Pain in right shoulder: Secondary | ICD-10-CM

## 2022-04-28 NOTE — Telephone Encounter (Signed)
Yes pls mri arthrogram to eval rct and labral tear thx

## 2022-05-12 ENCOUNTER — Encounter: Payer: Self-pay | Admitting: Surgical

## 2022-05-12 ENCOUNTER — Ambulatory Visit: Payer: BC Managed Care – PPO | Admitting: Surgical

## 2022-05-12 ENCOUNTER — Ambulatory Visit: Payer: Self-pay

## 2022-05-12 DIAGNOSIS — S43431A Superior glenoid labrum lesion of right shoulder, initial encounter: Secondary | ICD-10-CM

## 2022-05-12 DIAGNOSIS — M25511 Pain in right shoulder: Secondary | ICD-10-CM

## 2022-05-12 MED ORDER — LIDOCAINE HCL 1 % IJ SOLN
5.0000 mL | INTRAMUSCULAR | Status: AC | PRN
  Administered 2022-05-12: 5 mL

## 2022-05-12 MED ORDER — METHYLPREDNISOLONE ACETATE 40 MG/ML IJ SUSP
40.0000 mg | INTRAMUSCULAR | Status: AC | PRN
  Administered 2022-05-12: 40 mg via INTRA_ARTICULAR

## 2022-05-12 MED ORDER — BUPIVACAINE HCL 0.5 % IJ SOLN
9.0000 mL | INTRAMUSCULAR | Status: AC | PRN
  Administered 2022-05-12: 9 mL via INTRA_ARTICULAR

## 2022-05-12 NOTE — Progress Notes (Signed)
Office Visit Note   Patient: Mathew Cantrell           Date of Birth: Sep 30, 1975           MRN: [id number] Visit Date: 05/12/2022 Requested by: No referring provider defined for this encounter. PCP: Patient, No Pcp Per  Subjective: Chief Complaint  Patient presents with   Other     Scan review    HPI: Mathew Cantrell is a 46 y.o. male who presents to the office for MRI review. Patient denies any changes in symptoms aside from some mild improvement in his pain as he has completely stopped weightlifting.  Recently had a daughter last Friday and is excited to be caring for her.  Notes anterior pain in the right shoulder.  Occasional catching sensation in the shoulder but this is inconsistent.  Does not really notice any weakness in the arm relative to the left.  MRI results revealed: No results found.               ROS: All systems reviewed are negative as they relate to the chief complaint within the history of present illness.  Patient denies fevers or chills.  Assessment & Plan: Visit Diagnoses:  1. Degenerative superior labral anterior-to-posterior (SLAP) tear of right shoulder   2. Right shoulder pain, unspecified chronicity     Plan: Mathew Cantrell is a 46 y.o. male who presents to the office for evaluation of right shoulder pain.  He is here to review MRI of the right shoulder that was obtained about a week ago and demonstrates some fraying of the superior labrum and biceps anchor as well as some mild tendinopathy of the supraspinatus that looks to be primarily in the anterior attachment to the greater tuberosity.  He has absolutely no reproduction of pain with stressing the supraspinatus on exam today.  Does have moderate tenderness to palpation overlying the bicipital groove and with his occasional catching sensation, suspect that the labral pathology is the main pain driver in the shoulder.  Doing pretty "okay" right now but has not really tried weight lifting since he  has taken a break over the last several weeks.  He would like to eventually get back to doing this so after discussion, he would like to try glenohumeral injection today.  Under ultrasound guidance, right glenohumeral injection was successfully delivered.  Patient tolerated procedure well.  There is excellent flow felt into the glenohumeral joint without resistance.  He will give this 2 weeks to take effect and if no improvement from this injection, he can contact us at the 2-week mark and the neck step would be consideration of an ultrasound-guided bicep tendon sheath injection into the bicipital groove or discussion of bicep tenodesis surgery.  Patient is not really leaning toward surgical intervention at this point.  We will see how this does and he will follow-up as needed if symptoms do not improve.  Follow-Up Instructions: No follow-ups on file.   Orders:  Orders Placed This Encounter  Procedures   US Guided Needle Placement - No Linked Charges   No orders of the defined types were placed in this encounter.     Procedures: Large Joint Inj: R glenohumeral on 05/12/2022 7:16 PM Indications: diagnostic evaluation and pain Details: 22 G 3.5 in needle, ultrasound-guided posterior approach  Arthrogram: No  Medications: 9 mL bupivacaine 0.5 %; 40 mg methylPREDNISolone acetate 40 MG/ML; 5 mL lidocaine 1 % Outcome: tolerated well, no immediate complications Procedure, treatment alternatives,  risks and benefits explained, specific risks discussed. Consent was given by the patient. Immediately prior to procedure a time out was called to verify the correct patient, procedure, equipment, support staff and site/side marked as required. Patient was prepped and draped in the usual sterile fashion.       Clinical Data: No additional findings.  Objective: Vital Signs: There were no vitals taken for this visit.  Physical Exam:  Constitutional: Patient appears well-developed HEENT:  Head:  Normocephalic Eyes:EOM are normal Neck: Normal range of motion Cardiovascular: Normal rate Pulmonary/chest: Effort normal Neurologic: Patient is alert Skin: Skin is warm Psychiatric: Patient has normal mood and affect  Ortho Exam: Ortho exam demonstrates right shoulder with 60 degrees X rotation, 100 degrees abduction, 180 degrees forward flexion.  Axillary nerve is intact with deltoid firing on exam today.  Excellent strength of supra, infra, subscap with absolutely no reproduction of pain.  Moderate tenderness overlying the bicipital groove.  No tenderness over the [facility] joint.  No coarse grinding or crepitus noted passive motion of the shoulder.  Excellent strength of supination and bicep flexion with a little bit of increased pain from this.  No atrophy noted.  Negative belly press test.  Specialty Comments:  No specialty comments available.  Imaging: No results found.   PMFS History: Patient Active Problem List   Diagnosis Date Noted   AC separation, type 3, right, initial encounter    Past Medical History:  Diagnosis Date   Medical history non-contributory     No family history on file.  Past Surgical History:  Procedure Laterality Date   ACROMIO-CLAVICULAR JOINT REPAIR Right 08/09/2018   Procedure: RIGHT ACROMIOCLAVICULAR JOINT REPAIR;  Surgeon: [name], MD;  Location: [facility] OR;  Service: Orthopedics;  Laterality: Right;   KNEE SURGERY     TONSILLECTOMY     Social History   Occupational History   Not on file  Tobacco Use   Smoking status: Never   Smokeless tobacco: Never  Vaping Use   Vaping Use: Never used  Substance and Sexual Activity   Alcohol use: Yes    Comment: wine every few weeks   Drug use: Never   Sexual activity: Not on file

## 2022-07-30 ENCOUNTER — Encounter: Payer: Self-pay | Admitting: Orthopedic Surgery

## 2022-08-01 NOTE — Telephone Encounter (Signed)
[name], can you make appointment for Mathew Cantrell to come in for bicipital groove injection at next available with dr dean or myself

## 2022-08-01 NOTE — Telephone Encounter (Signed)
Pls call maybe one more shot

## 2022-08-10 ENCOUNTER — Ambulatory Visit: Payer: BC Managed Care – PPO | Admitting: Orthopedic Surgery

## 2022-08-25 ENCOUNTER — Ambulatory Visit: Payer: BC Managed Care – PPO | Admitting: Surgical

## 2022-08-25 ENCOUNTER — Ambulatory Visit: Payer: Self-pay

## 2022-08-25 ENCOUNTER — Encounter: Payer: Self-pay | Admitting: Surgical

## 2022-08-25 DIAGNOSIS — M7521 Bicipital tendinitis, right shoulder: Secondary | ICD-10-CM | POA: Diagnosis not present

## 2022-08-25 MED ORDER — LIDOCAINE HCL 1 % IJ SOLN
5.0000 mL | INTRAMUSCULAR | Status: AC | PRN
  Administered 2022-08-25: 5 mL

## 2022-08-25 MED ORDER — METHYLPREDNISOLONE ACETATE 40 MG/ML IJ SUSP
40.0000 mg | INTRAMUSCULAR | Status: AC | PRN
  Administered 2022-08-25: 40 mg via INTRA_ARTICULAR

## 2022-08-25 MED ORDER — BUPIVACAINE HCL 0.5 % IJ SOLN
4.0000 mL | INTRAMUSCULAR | Status: AC | PRN
  Administered 2022-08-25: 4 mL via INTRA_ARTICULAR

## 2022-08-25 NOTE — Progress Notes (Signed)
   Procedure Note (bicipital groove injection)  Patient: Mathew Cantrell             Date of Birth: [birth year]           MRN: [id number]             Visit Date: 08/25/2022  Procedures: Visit Diagnoses:  1. Biceps tendinitis of right shoulder     Large Joint Inj: R glenohumeral on 08/25/2022 4:56 PM Indications: diagnostic evaluation and pain Details: 18 G 1.5 in and 3.5 in needle, ultrasound-guided lateral approach  Arthrogram: No  Medications: 4 mL bupivacaine 0.5 %; 40 mg methylPREDNISolone acetate 40 MG/ML; 5 mL lidocaine 1 % Outcome: tolerated well, no immediate complications  Patient comes in today for right shoulder bicipital groove injection.  He had glenohumeral injection in September but really cannot recall if he had any substantial relief from that injection.  He continues to complain of primarily anterior shoulder pain that localizes to the bicipital groove on exam today.  No AC joint tenderness.  Mostly bothers him with lifting up and away from his body and he has associated clicking and popping sensations in the shoulder.  On ultrasound exam, he has bicep tendon located in the bicipital groove with some surrounding hypoechoic fluid but no evidence of synovitis on color Doppler.  He had examination of the subscapularis tendon with no defect noted.  Passive rotation of the shoulder demonstrated no dislocation of the bicep tendon from the groove.  After examination, ultrasound-guided injection was delivered into the bicipital groove.  He tolerated the procedure well without difficulty.  There is excellent flow without resistance of the injection during the entirety of the administration.  He did have substantial relief about 5 minutes after the injection with all of the typical maneuvers that caused him pain prior.  He will send [place] a message after about a week to let us know how he is doing.  If he has good long-lasting relief from this injection, we can consider doing this again in  the future 1 more time.  If he has short-lived but excellent relief, next step would be bicep tenodesis. Procedure, treatment alternatives, risks and benefits explained, specific risks discussed. Consent was given by the patient. Immediately prior to procedure a time out was called to verify the correct patient, procedure, equipment, support staff and site/side marked as required. Patient was prepped and draped in the usual sterile fashion.

## 2022-11-16 NOTE — Progress Notes (Signed)
Surgical Instructions    Your procedure is scheduled on Thursday, 11/24/22.  Report to [facility] Main Entrance "A" at 1:30 P.M., then check in with the Admitting office.  Call this number if you have problems the morning of surgery:  [phone]   If you have any questions prior to your surgery date call [phone]: Open Monday-Friday 8am-4pm If you experience any cold or flu symptoms such as cough, fever, chills, shortness of breath, etc. between now and your scheduled surgery, please notify us at the above number     Remember:  Do not eat after midnight the night before your surgery  You may drink clear liquids until 12:30 P.M. the morning of your surgery.   Clear liquids allowed are: Water, Non-Citrus Juices (without pulp), Carbonated Beverages, Clear Tea, Black Coffee ONLY (NO MILK, CREAM OR POWDERED CREAMER of any kind), and Gatorade  Patient Instructions  The night before surgery:  No food after midnight. ONLY clear liquids after midnight  The day of surgery (if you do NOT have diabetes):  Drink ONE (1) Pre-Surgery Clear Ensure by 12:30PM the morning of surgery. Drink in one sitting. Do not sip.  This drink was given to you during your hospital  pre-op appointment visit. Nothing else to drink after completing the  Pre-Surgery Clear Ensure.          If you have questions, please contact your surgeon's office.     Take these medicines the morning of surgery with A SIP OF WATER:  amLODipine (NORVASC)    As of today, STOP taking any Aspirin (unless otherwise instructed by your surgeon) Aleve, Naproxen, Ibuprofen, Motrin, Advil, Goody's, BC's, all herbal medications, fish oil, and all vitamins.           Do not wear jewelry or makeup. Do not wear lotions, powders, cologne or deodorant. Men may shave face and neck. Do not bring valuables to the hospital. Do not wear nail polish, gel polish, artificial nails, or any other type of covering on natural nails (fingers and  toes) If you have artificial nails or gel coating that need to be removed by a nail salon, please have this removed prior to surgery. Artificial nails or gel coating may interfere with anesthesia's ability to adequately monitor your vital signs.  [facility] is not responsible for any belongings or valuables.    Do NOT Smoke (Tobacco/Vaping)  24 hours prior to your procedure  If you use a CPAP at night, you may bring your mask for your overnight stay.   Contacts, glasses, hearing aids, dentures or partials may not be worn into surgery, please bring cases for these belongings   For patients admitted to the hospital, discharge time will be determined by your treatment team.   Patients discharged the day of surgery will not be allowed to drive home, and someone needs to stay with them for 24 hours.   SURGICAL WAITING ROOM VISITATION Patients having surgery or a procedure may have no more than 2 support people in the waiting area - these visitors may rotate.   Children under the age of 63 must have an adult with them who is not the patient. If the patient needs to stay at the hospital during part of their recovery, the visitor guidelines for inpatient rooms apply. Pre-op nurse will coordinate an appropriate time for 1 support person to accompany patient in pre-op.  This support person may not rotate.   Please refer to [email or web address].hu for the visitor guidelines for Inpatients (  after your surgery is over and you are in a regular room).    Special instructions:    Oral Hygiene is also important to reduce your risk of infection.  Remember - BRUSH YOUR TEETH THE MORNING OF SURGERY WITH YOUR REGULAR TOOTHPASTE   [facility] [facility]- Preparing For Surgery  Before surgery, you can play an important role. Because skin is not sterile, your skin needs to be as free of germs as possible. You can reduce the number of germs on your skin by washing with CHG  (chlorahexidine gluconate) Soap before surgery.  CHG is an antiseptic cleaner which kills germs and bonds with the skin to continue killing germs even after washing.     Please do not use if you have an allergy to CHG or antibacterial soaps. If your skin becomes reddened/irritated stop using the CHG.  Do not shave (including legs and underarms) for at least 48 hours prior to first CHG shower. It is OK to shave your face.  Please follow these instructions carefully.     Shower the NIGHT BEFORE SURGERY and the MORNING OF SURGERY with CHG Soap.   If you chose to wash your hair, wash your hair first as usual with your normal shampoo. After you shampoo, rinse your hair and body thoroughly to remove the shampoo.  Then [employer] and genitals (private parts) with your normal soap and rinse thoroughly to remove soap.  After that Use CHG Soap as you would any other liquid soap. You can apply CHG directly to the skin and wash gently with a scrungie or a clean washcloth.   Apply the CHG Soap to your body ONLY FROM THE NECK DOWN.  Do not use on open wounds or open sores. Avoid contact with your eyes, ears, mouth and genitals (private parts). Wash Face and genitals (private parts)  with your normal soap.   Wash thoroughly, paying special attention to the area where your surgery will be performed.  Thoroughly rinse your body with warm water from the neck down.  DO NOT shower/wash with your normal soap after using and rinsing off the CHG Soap.  Pat yourself dry with a CLEAN TOWEL.  Wear CLEAN PAJAMAS to bed the night before surgery  Place CLEAN SHEETS on your bed the night before your surgery  DO NOT SLEEP WITH PETS.   Day of Surgery: Take a shower with CHG soap. Wear Clean/Comfortable clothing the morning of surgery Do not apply any deodorants/lotions.   Remember to brush your teeth WITH YOUR REGULAR TOOTHPASTE.    If you received a COVID test during your pre-op visit, it is requested that  you wear a mask when out in public, stay away from anyone that may not be feeling well, and notify your surgeon if you develop symptoms. If you have been in contact with anyone that has tested positive in the last 10 days, please notify your surgeon.    Please read over the following fact sheets that you were given.

## 2022-11-17 ENCOUNTER — Other Ambulatory Visit: Payer: Self-pay

## 2022-11-17 ENCOUNTER — Encounter (HOSPITAL_COMMUNITY): Payer: Self-pay

## 2022-11-17 ENCOUNTER — Encounter (HOSPITAL_COMMUNITY)
Admission: RE | Admit: 2022-11-17 | Discharge: 2022-11-17 | Disposition: A | Discharge status: Home / Self Care | Payer: BC Managed Care – PPO | Source: Ambulatory Visit | Attending: Orthopedic Surgery | Admitting: Orthopedic Surgery

## 2022-11-17 VITALS — BP 140/98 | HR 95 | Temp 97.7°F | Resp 17 | Ht 76.0 in | Wt 262.0 lb

## 2022-11-17 DIAGNOSIS — I2510 Atherosclerotic heart disease of native coronary artery without angina pectoris: Secondary | ICD-10-CM | POA: Insufficient documentation

## 2022-11-17 DIAGNOSIS — Z01818 Encounter for other preprocedural examination: Secondary | ICD-10-CM

## 2022-11-17 HISTORY — DX: Pneumonia, unspecified organism: J18.9

## 2022-11-17 HISTORY — DX: Essential (primary) hypertension: I10

## 2022-11-17 LAB — BASIC METABOLIC PANEL
Anion gap: 10 (ref 5–15)
BUN: 11 mg/dL (ref 6–20)
CO2: 27 mmol/L (ref 22–32)
Calcium: 10.1 mg/dL (ref 8.9–10.3)
Chloride: 101 mmol/L (ref 98–111)
Creatinine, Ser: 0.95 mg/dL (ref 0.61–1.24)
GFR, Estimated: >60 mL/min (ref >60)
Glucose, Bld: 117 mg/dL — ABNORMAL HIGH (ref 70–99)
Potassium: 3.6 mmol/L (ref 3.5–5.1)
Sodium: 138 mmol/L (ref 135–145)

## 2022-11-17 LAB — CBC
HCT: 44.8 % (ref 39.0–52.0)
Hemoglobin: 15.9 g/dL (ref 13.0–17.0)
MCH: 32.7 pg (ref 26.0–34.0)
MCHC: 35.5 g/dL (ref 30.0–36.0)
MCV: 92.2 fL (ref 80.0–100.0)
Platelets: 292 10*3/uL (ref 150–400)
RBC: 4.86 MIL/uL (ref 4.22–5.81)
RDW: 11.9 % (ref 11.5–15.5)
WBC: 7.8 10*3/uL (ref 4.0–10.5)
nRBC: 0 % (ref 0.0–0.2)

## 2022-11-17 NOTE — Progress Notes (Signed)
PCP - [place] Cardiologist - denies  PPM/ICD - denies   Chest x-ray - n/a EKG - 11/17/22 Stress Test - denies ECHO - denies Cardiac Cath - denies  Sleep Study - denies   ERAS Protcol -yes PRE-SURGERY Ensure or G2- ensure given  COVID TEST- not needed   Anesthesia review: no  Patient denies shortness of breath, fever, cough and chest pain at PAT appointment   All instructions explained to the patient, with a verbal understanding of the material. Patient agrees to go over the instructions while at home for a better understanding. Patient also instructed to self quarantine after being tested for COVID-19. The opportunity to ask questions was provided.

## 2022-11-24 ENCOUNTER — Other Ambulatory Visit: Payer: Self-pay

## 2022-11-24 ENCOUNTER — Ambulatory Visit (HOSPITAL_COMMUNITY)
Admission: RE | Admit: 2022-11-24 | Discharge: 2022-11-24 | Disposition: A | Discharge status: Home / Self Care | Payer: BC Managed Care – PPO | Attending: Orthopedic Surgery | Admitting: Orthopedic Surgery

## 2022-11-24 ENCOUNTER — Encounter (HOSPITAL_COMMUNITY): Payer: Self-pay | Admitting: Orthopedic Surgery

## 2022-11-24 ENCOUNTER — Ambulatory Visit (HOSPITAL_COMMUNITY): Payer: BC Managed Care – PPO | Admitting: Registered Nurse

## 2022-11-24 ENCOUNTER — Encounter (HOSPITAL_COMMUNITY)
Admission: RE | Disposition: A | Discharge status: Home / Self Care | Payer: Self-pay | Source: Home / Self Care | Attending: Orthopedic Surgery

## 2022-11-24 DIAGNOSIS — I10 Essential (primary) hypertension: Secondary | ICD-10-CM | POA: Insufficient documentation

## 2022-11-24 DIAGNOSIS — M94211 Chondromalacia, right shoulder: Secondary | ICD-10-CM

## 2022-11-24 DIAGNOSIS — S43431A Superior glenoid labrum lesion of right shoulder, initial encounter: Secondary | ICD-10-CM | POA: Diagnosis not present

## 2022-11-24 DIAGNOSIS — Z79899 Other long term (current) drug therapy: Secondary | ICD-10-CM | POA: Insufficient documentation

## 2022-11-24 DIAGNOSIS — M7521 Bicipital tendinitis, right shoulder: Secondary | ICD-10-CM | POA: Insufficient documentation

## 2022-11-24 DIAGNOSIS — X58XXXA Exposure to other specified factors, initial encounter: Secondary | ICD-10-CM | POA: Insufficient documentation

## 2022-11-24 DIAGNOSIS — M19011 Primary osteoarthritis, right shoulder: Secondary | ICD-10-CM | POA: Diagnosis not present

## 2022-11-24 DIAGNOSIS — S43432A Superior glenoid labrum lesion of left shoulder, initial encounter: Secondary | ICD-10-CM | POA: Diagnosis not present

## 2022-11-24 DIAGNOSIS — Z01818 Encounter for other preprocedural examination: Secondary | ICD-10-CM

## 2022-11-24 HISTORY — PX: SHOULDER ARTHROSCOPY: SHX128

## 2022-11-24 HISTORY — PX: BICEPT TENODESIS: SHX5116

## 2022-11-24 SURGERY — ARTHROSCOPY, SHOULDER
Anesthesia: General | Site: Shoulder | Laterality: Right

## 2022-11-24 MED ORDER — ORAL CARE MOUTH RINSE: 15.0000 mL | Freq: Once | OROMUCOSAL | Status: AC

## 2022-11-24 MED ORDER — PROPOFOL 10 MG/ML IV BOLUS
INTRAVENOUS | Status: AC
  Filled 2022-11-24: qty 20

## 2022-11-24 MED ORDER — FENTANYL CITRATE (PF) 100 MCG/2ML IJ SOLN: 25.0000 ug | INTRAMUSCULAR | Status: DC | PRN

## 2022-11-24 MED ORDER — DEXAMETHASONE SODIUM PHOSPHATE 10 MG/ML IJ SOLN
INTRAMUSCULAR | Status: DC | PRN
  Administered 2022-11-24: 10 mg via INTRAVENOUS

## 2022-11-24 MED ORDER — OXYCODONE HCL 5 MG PO TABS: 5.0000 mg | ORAL_TABLET | ORAL | 0 refills | Status: DC | PRN

## 2022-11-24 MED ORDER — SODIUM CHLORIDE 0.9 % IR SOLN
Status: DC | PRN
  Administered 2022-11-24 (×2): 3000 mL

## 2022-11-24 MED ORDER — CELECOXIB 100 MG PO CAPS: 100.0000 mg | ORAL_CAPSULE | Freq: Two times a day (BID) | ORAL | 0 refills | Status: AC

## 2022-11-24 MED ORDER — PHENYLEPHRINE HCL-NACL 20-0.9 MG/250ML-% IV SOLN
INTRAVENOUS | Status: DC | PRN
  Administered 2022-11-24: 25 ug/min via INTRAVENOUS

## 2022-11-24 MED ORDER — FENTANYL CITRATE (PF) 100 MCG/2ML IJ SOLN: 100.0000 ug | Freq: Once | INTRAMUSCULAR | Status: AC

## 2022-11-24 MED ORDER — BUPIVACAINE HCL (PF) 0.5 % IJ SOLN
INTRAMUSCULAR | Status: DC | PRN
  Administered 2022-11-24: 15 mL via PERINEURAL

## 2022-11-24 MED ORDER — SUGAMMADEX SODIUM 200 MG/2ML IV SOLN
INTRAVENOUS | Status: DC | PRN
  Administered 2022-11-24: 200 mg via INTRAVENOUS

## 2022-11-24 MED ORDER — ACETAMINOPHEN 500 MG PO TABS
ORAL_TABLET | ORAL | Status: AC
  Administered 2022-11-24: 1000 mg via ORAL
  Filled 2022-11-24: qty 2

## 2022-11-24 MED ORDER — FENTANYL CITRATE (PF) 250 MCG/5ML IJ SOLN
INTRAMUSCULAR | Status: AC
  Filled 2022-11-24: qty 5

## 2022-11-24 MED ORDER — MIDAZOLAM HCL 2 MG/2ML IJ SOLN
INTRAMUSCULAR | Status: AC
  Administered 2022-11-24: 2 mg via INTRAVENOUS
  Filled 2022-11-24: qty 2

## 2022-11-24 MED ORDER — GABAPENTIN 300 MG PO CAPS: 300.0000 mg | ORAL_CAPSULE | Freq: Three times a day (TID) | ORAL | 0 refills | Status: AC

## 2022-11-24 MED ORDER — FENTANYL CITRATE (PF) 250 MCG/5ML IJ SOLN
INTRAMUSCULAR | Status: DC | PRN
  Administered 2022-11-24: 100 ug via INTRAVENOUS

## 2022-11-24 MED ORDER — AMISULPRIDE (ANTIEMETIC) 5 MG/2ML IV SOLN: 10.0000 mg | Freq: Once | INTRAVENOUS | Status: DC | PRN

## 2022-11-24 MED ORDER — ROCURONIUM BROMIDE 10 MG/ML (PF) SYRINGE
PREFILLED_SYRINGE | INTRAVENOUS | Status: DC | PRN
  Administered 2022-11-24: 100 mg via INTRAVENOUS

## 2022-11-24 MED ORDER — GLYCOPYRROLATE PF 0.2 MG/ML IJ SOSY
PREFILLED_SYRINGE | INTRAMUSCULAR | Status: AC
  Filled 2022-11-24: qty 1

## 2022-11-24 MED ORDER — PROPOFOL 10 MG/ML IV BOLUS
INTRAVENOUS | Status: DC | PRN
  Administered 2022-11-24 (×2): 200 mg via INTRAVENOUS

## 2022-11-24 MED ORDER — GLYCOPYRROLATE PF 0.2 MG/ML IJ SOSY
PREFILLED_SYRINGE | INTRAMUSCULAR | Status: DC | PRN
  Administered 2022-11-24 (×2): .1 mg via INTRAVENOUS

## 2022-11-24 MED ORDER — CHLORHEXIDINE GLUCONATE 0.12 % MT SOLN: 15.0000 mL | Freq: Once | OROMUCOSAL | Status: AC

## 2022-11-24 MED ORDER — ONDANSETRON HCL 4 MG/2ML IJ SOLN
INTRAMUSCULAR | Status: AC
  Filled 2022-11-24: qty 2

## 2022-11-24 MED ORDER — TRANEXAMIC ACID-NACL 1000-0.7 MG/100ML-% IV SOLN
1000.0000 mg | INTRAVENOUS | Status: AC
  Administered 2022-11-24: 1000 mg via INTRAVENOUS

## 2022-11-24 MED ORDER — DEXAMETHASONE SODIUM PHOSPHATE 10 MG/ML IJ SOLN
INTRAMUSCULAR | Status: AC
  Filled 2022-11-24: qty 1

## 2022-11-24 MED ORDER — CHLORHEXIDINE GLUCONATE 0.12 % MT SOLN
OROMUCOSAL | Status: AC
  Administered 2022-11-24: 15 mL via OROMUCOSAL
  Filled 2022-11-24: qty 15

## 2022-11-24 MED ORDER — METHOCARBAMOL 500 MG PO TABS: 500.0000 mg | ORAL_TABLET | Freq: Three times a day (TID) | ORAL | 1 refills | Status: AC | PRN

## 2022-11-24 MED ORDER — PROMETHAZINE HCL 25 MG/ML IJ SOLN: 6.2500 mg | INTRAMUSCULAR | Status: DC | PRN

## 2022-11-24 MED ORDER — MIDAZOLAM HCL 2 MG/2ML IJ SOLN
INTRAMUSCULAR | Status: AC
  Filled 2022-11-24: qty 2

## 2022-11-24 MED ORDER — ACETAMINOPHEN 500 MG PO TABS: 1000.0000 mg | ORAL_TABLET | Freq: Once | ORAL | Status: AC

## 2022-11-24 MED ORDER — LACTATED RINGERS IV SOLN: INTRAVENOUS | Status: DC

## 2022-11-24 MED ORDER — FENTANYL CITRATE (PF) 100 MCG/2ML IJ SOLN
INTRAMUSCULAR | Status: AC
  Administered 2022-11-24: 100 ug via INTRAVENOUS
  Filled 2022-11-24: qty 2

## 2022-11-24 MED ORDER — ONDANSETRON HCL 4 MG/2ML IJ SOLN
INTRAMUSCULAR | Status: DC | PRN
  Administered 2022-11-24: 4 mg via INTRAVENOUS

## 2022-11-24 MED ORDER — TRANEXAMIC ACID-NACL 1000-0.7 MG/100ML-% IV SOLN
INTRAVENOUS | Status: AC
  Filled 2022-11-24: qty 100

## 2022-11-24 MED ORDER — MIDAZOLAM HCL 2 MG/2ML IJ SOLN: 2.0000 mg | Freq: Once | INTRAMUSCULAR | Status: AC

## 2022-11-24 MED ORDER — 0.9 % SODIUM CHLORIDE (POUR BTL) OPTIME
TOPICAL | Status: DC | PRN
  Administered 2022-11-24: 1000 mL

## 2022-11-24 MED ORDER — CEFAZOLIN SODIUM-DEXTROSE 2-4 GM/100ML-% IV SOLN
INTRAVENOUS | Status: AC
  Filled 2022-11-24: qty 100

## 2022-11-24 MED ORDER — BUPIVACAINE LIPOSOME 1.3 % IJ SUSP
INTRAMUSCULAR | Status: AC
  Filled 2022-11-24: qty 10

## 2022-11-24 MED ORDER — BUPIVACAINE LIPOSOME 1.3 % IJ SUSP
INTRAMUSCULAR | Status: DC | PRN
  Administered 2022-11-24: 10 mL via PERINEURAL

## 2022-11-24 MED ORDER — MIDAZOLAM HCL 2 MG/2ML IJ SOLN
INTRAMUSCULAR | Status: DC | PRN
  Administered 2022-11-24: 2 mg via INTRAVENOUS

## 2022-11-24 MED ORDER — CEFAZOLIN SODIUM-DEXTROSE 2-4 GM/100ML-% IV SOLN
2.0000 g | INTRAVENOUS | Status: AC
  Administered 2022-11-24: 2 g via INTRAVENOUS

## 2022-11-24 SURGICAL SUPPLY — 62 items
AID PSTN UNV HD RSTRNT DISP (MISCELLANEOUS) ×1
ALCOHOL 70% 16 OZ (MISCELLANEOUS) ×1 IMPLANT
ANCH SUT 2 FBRTK KNTLS 1.8 (Anchor) ×1 IMPLANT
ANCHOR SUT 1.8 FIBERTAK SB KL (Anchor) IMPLANT
BAG COUNTER SPONGE SURGICOUNT (BAG) ×1 IMPLANT
BAG SPNG CNTER NS LX DISP (BAG) ×1
BLADE EXCALIBUR 4.0X13 (MISCELLANEOUS) ×1 IMPLANT
BLADE SHAVER TORPEDO 4X13 (MISCELLANEOUS) IMPLANT
BLADE SURG 11 STRL SS (BLADE) ×1 IMPLANT
DRAPE IMP U-DRAPE 54X76 (DRAPES) ×1 IMPLANT
DRAPE INCISE IOBAN 66X45 STRL (DRAPES) ×1 IMPLANT
DRAPE STERI 35X30 U-POUCH (DRAPES) ×1 IMPLANT
DRAPE U-SHAPE 47X51 STRL (DRAPES) ×2 IMPLANT
DRSG AQUACEL AG ADV 3.5X 4 (GAUZE/BANDAGES/DRESSINGS) ×1 IMPLANT
DRSG TEGADERM 4X4.75 (GAUZE/BANDAGES/DRESSINGS) IMPLANT
DURAPREP 26ML APPLICATOR (WOUND CARE) ×1 IMPLANT
DW OUTFLOW CASSETTE/TUBE SET (MISCELLANEOUS) ×1 IMPLANT
ELECT REM PT RETURN 9FT ADLT (ELECTROSURGICAL) ×1
ELECTRODE REM PT RTRN 9FT ADLT (ELECTROSURGICAL) ×1 IMPLANT
EXCALIBUR 3.8MM X 13CM (MISCELLANEOUS) IMPLANT
GAUZE SPONGE 4X4 12PLY STRL (GAUZE/BANDAGES/DRESSINGS) IMPLANT
GAUZE XEROFORM 1X8 LF (GAUZE/BANDAGES/DRESSINGS) ×1 IMPLANT
GAUZE XEROFORM 5X9 LF (GAUZE/BANDAGES/DRESSINGS) IMPLANT
GLOVE BIOGEL M 6.5 STRL (GLOVE) ×1 IMPLANT
GLOVE BIOGEL PI IND STRL 6.5 (GLOVE) ×1 IMPLANT
GLOVE BIOGEL PI IND STRL 8 (GLOVE) ×1 IMPLANT
GLOVE ECLIPSE 8.0 STRL XLNG CF (GLOVE) ×1 IMPLANT
GOWN STRL REUS W/ TWL LRG LVL3 (GOWN DISPOSABLE) ×3 IMPLANT
GOWN STRL REUS W/TWL LRG LVL3 (GOWN DISPOSABLE) ×3
KIT BASIN OR (CUSTOM PROCEDURE TRAY) ×1 IMPLANT
KIT STR SPEAR 1.8 FBRTK DISP (KITS) IMPLANT
KIT TURNOVER KIT B (KITS) ×1 IMPLANT
MANIFOLD NEPTUNE II (INSTRUMENTS) ×1 IMPLANT
NDL HYPO 25X1 1.5 SAFETY (NEEDLE) ×1 IMPLANT
NDL SPNL 18GX3.5 QUINCKE PK (NEEDLE) ×1 IMPLANT
NEEDLE HYPO 25X1 1.5 SAFETY (NEEDLE) ×1 IMPLANT
NEEDLE SPNL 18GX3.5 QUINCKE PK (NEEDLE) ×1 IMPLANT
NS IRRIG 1000ML POUR BTL (IV SOLUTION) ×1 IMPLANT
PACK SHOULDER (CUSTOM PROCEDURE TRAY) ×1 IMPLANT
PAD ARMBOARD 7.5X6 YLW CONV (MISCELLANEOUS) ×2 IMPLANT
PORT APPOLLO RF 90DEGREE MULTI (SURGICAL WAND) ×1 IMPLANT
PROBE APOLLO 90XL (SURGICAL WAND) IMPLANT
RESTRAINT HEAD UNIVERSAL NS (MISCELLANEOUS) ×1 IMPLANT
SLING ARM FOAM STRAP LRG (SOFTGOODS) IMPLANT
SPONGE T-LAP 4X18 ~~LOC~~+RFID (SPONGE) ×1 IMPLANT
STRIP CLOSURE SKIN 1/2X4 (GAUZE/BANDAGES/DRESSINGS) IMPLANT
SUCTION FRAZIER HANDLE 10FR (MISCELLANEOUS)
SUCTION TUBE FRAZIER 10FR DISP (MISCELLANEOUS) IMPLANT
SUT ETHILON 3 0 PS 1 (SUTURE) ×1 IMPLANT
SUT MNCRL AB 3-0 PS2 18 (SUTURE) IMPLANT
SUT VIC AB 0 CT1 27 (SUTURE) ×1
SUT VIC AB 0 CT1 27XBRD ANBCTR (SUTURE) IMPLANT
SUT VIC AB 1 CT1 36 (SUTURE) IMPLANT
SUT VIC AB 2-0 CT1 27 (SUTURE) ×2
SUT VIC AB 2-0 CT1 TAPERPNT 27 (SUTURE) IMPLANT
SUT VICRYL 0 UR6 27IN ABS (SUTURE) IMPLANT
SYS FBRTK BUTTON 2.6 (Anchor) ×1 IMPLANT
SYSTEM FBRTK BUTTON 2.6 (Anchor) IMPLANT
TOWEL GREEN STERILE (TOWEL DISPOSABLE) ×1 IMPLANT
TOWEL GREEN STERILE FF (TOWEL DISPOSABLE) ×1 IMPLANT
TUBING ARTHROSCOPY IRRIG 16FT (MISCELLANEOUS) ×1 IMPLANT
WATER STERILE IRR 1000ML POUR (IV SOLUTION) ×1 IMPLANT

## 2022-11-24 NOTE — Anesthesia Preprocedure Evaluation (Addendum)
Anesthesia Evaluation  Patient identified by MRN, date of birth, ID band Patient awake    Reviewed: Allergy & Precautions, NPO status , Patient's Chart, lab work & pertinent test results  History of Anesthesia Complications Negative for: history of anesthetic complications  Airway Mallampati: II  TM Distance: >3 FB Neck ROM: Full    Dental no notable dental hx. (+) Dental Advisory Given   Pulmonary neg pulmonary ROS   Pulmonary exam normal        Cardiovascular hypertension, Pt. on medications Normal cardiovascular exam     Neuro/Psych negative neurological ROS     GI/Hepatic negative GI ROS, Neg liver ROS,,,  Endo/Other  negative endocrine ROS    Renal/GU negative Renal ROS     Musculoskeletal negative musculoskeletal ROS (+)    Abdominal   Peds  Hematology negative hematology ROS (+)   Anesthesia Other Findings   Reproductive/Obstetrics                             Anesthesia Physical Anesthesia Plan  ASA: 2  Anesthesia Plan: General   Post-op Pain Management: Regional block*, Tylenol PO (pre-op)* and Toradol IV (intra-op)*   Induction: Intravenous  PONV Risk Score and Plan: 2 and Ondansetron, Dexamethasone and Midazolam  Airway Management Planned: Oral ETT  Additional Equipment:   Intra-op Plan:   Post-operative Plan: Extubation in OR  Informed Consent: I have reviewed the patients History and Physical, chart, labs and discussed the procedure including the risks, benefits and alternatives for the proposed anesthesia with the patient or authorized representative who has indicated his/her understanding and acceptance.     Dental advisory given  Plan Discussed with: Anesthesiologist, CRNA and Surgeon  Anesthesia Plan Comments:        Anesthesia Quick Evaluation

## 2022-11-24 NOTE — Progress Notes (Signed)
Patient's B/P elevated Dr. Tobias Alexander aware.

## 2022-11-24 NOTE — Anesthesia Procedure Notes (Signed)
Procedure Name: Intubation Date/Time: 11/24/2022 3:26 PM  Performed by: Ester Rink, CRNAPre-anesthesia Checklist: Patient identified, Emergency Drugs available, Suction available and Patient being monitored Patient Re-evaluated:Patient Re-evaluated prior to induction Oxygen Delivery Method: Circle system utilized Preoxygenation: Pre-oxygenation with 100% oxygen Induction Type: IV induction Ventilation: Mask ventilation without difficulty Laryngoscope Size: Mac and 4 Grade View: Grade II Tube type: Oral Tube size: 7.5 mm Number of attempts: 1 Airway Equipment and Method: Stylet and Oral airway Placement Confirmation: ETT inserted through vocal cords under direct vision, positive ETCO2 and breath sounds checked- equal and bilateral Secured at: 23 cm Tube secured with: Tape Dental Injury: Teeth and Oropharynx as per pre-operative assessment

## 2022-11-24 NOTE — Anesthesia Procedure Notes (Signed)
Anesthesia Regional Block: Interscalene brachial plexus block   Pre-Anesthetic Checklist: , timeout performed,  Correct Patient, Correct Site, Correct Laterality,  Correct Procedure, Correct Position, site marked,  Risks and benefits discussed,  Surgical consent,  Pre-op evaluation,  At surgeon's request and post-op pain management  Laterality: Right  Prep: chloraprep       Needles:  Injection technique: Single-shot  Needle Type: Echogenic Stimulator Needle     Needle Length: 5cm  Needle Gauge: 22     Additional Needles:   Procedures:, nerve stimulator,,, ultrasound used (permanent image in chart),,     Nerve Stimulator or Paresthesia:  Response: hand, 0.45 mA  Additional Responses:   Narrative:  Start time: 11/24/2022 2:45 PM End time: 11/24/2022 2:50 PM Injection made incrementally with aspirations every 5 mL.  Performed by: Personally  Anesthesiologist: Janeece Riggers, MD  Additional Notes: Functioning IV was confirmed and monitors were applied.  A 77mm 22ga Arrow echogenic stimulator needle was used. Sterile prep and drape,hand hygiene and sterile gloves were used. Ultrasound guidance: relevant anatomy identified, needle position confirmed, local anesthetic spread visualized around nerve(s)., vascular puncture avoided.  Image printed for medical record. Negative aspiration and negative test dose prior to incremental administration of local anesthetic. The patient tolerated the procedure well.

## 2022-11-24 NOTE — Transfer of Care (Signed)
Immediate Anesthesia Transfer of Care Note  Patient: Malikhai Coopersmith  Procedure(s) Performed: RIGHT SHOULDER ARTHROSCOPY, DEBRIDEMENT (Right: Shoulder) BICEPS TENODESIS (Right: Shoulder)  Patient Location: PACU  Anesthesia Type:General and Regional  Level of Consciousness: awake, alert , and oriented  Airway & Oxygen Therapy: Patient Spontanous Breathing  Post-op Assessment: Report given to RN and Post -op Vital signs reviewed and stable  Post vital signs: Reviewed and stable  Last Vitals:  Vitals Value Taken Time  BP    Temp    Pulse 97 11/24/22 1805  Resp 12 11/24/22 1805  SpO2 98 % 11/24/22 1805  Vitals shown include unvalidated device data.  Last Pain:  Vitals:   11/24/22 1339  TempSrc: Oral  PainSc: 2       Patients Stated Pain Goal: 1 ([id number])  Complications: There were no known notable events for this encounter.

## 2022-11-24 NOTE — Op Note (Signed)
NAME: Mathew Cantrell, Mathew Cantrell MEDICAL RECORD NO: [id number] ACCOUNT NO: [id number] DATE OF BIRTH: Jan 10, 1976 FACILITY: [facility] LOCATION: [facility] PHYSICIAN: Yetta Barre. Marlou Sa, MD  Operative Report   DATE OF PROCEDURE: 11/24/2022  PREOPERATIVE DIAGNOSIS:  Right shoulder biceps tendinitis and SLAP tear.  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  PROCEDURE:  Right shoulder arthroscopy with extensive debridement of the humeral head, loose chondral flaps and removal of loose bodies with further debridement of the superior labrum release of the biceps tendon and mini open biceps tenodesis using 2  Arthrex suture anchors.  SURGEON:  Yetta Barre. Marlou Sa, MD  ASSISTANT:  Annie Main.  INDICATIONS:  This is a 46 year old patient with right shoulder pain, who presents for operative management after explanation of risks and benefits.  DESCRIPTION OF PROCEDURE:  The patient was brought to the operating room where general endotracheal anesthesia was induced.  Preoperative antibiotics administered.  Timeout was called.  The patient was placed in the beach chair position with head in  neutral position.  Right shoulder, arm and hand prescrubbed with alcohol and Betadine, allowed to air dry, prepped with DuraPrep solution and draped in sterile manner.  Ioban used to cover the operative field and seal the axilla.  Examination under  anesthesia demonstrated range of motion of 70/110/170.  Good stability anteriorly and posteriorly.  The patient had some coarseness with internal and external rotation at 90 degrees of abduction.  Posterior portal was created 2 cm medial and inferior to  the posterolateral margin of the acromion.  Anterior portal created under direct visualization.  Diagnostic arthroscopy was performed.  The patient had intact anterior inferior, posterior inferior  glenohumeral ligaments.  Rotator cuff was intact with  mild tendinosis of the supraspinatus tendon.  Subscap intraarticular and infraspinatus intact.  The patient had a large area of full thickness chondral loss with loose chondral flaps and loose bodies on the posterior aspect of the humeral head.  This was  not Hill-Sachs lesion, but more delamination of the cartilage.  The loose chondral flaps were extensively debrided using a shaver as well as portal reversal.  Debridement of the calcified cartilage layer was performed when the accessible with portal  reversal.  Next, the superior labrum was debrided.  Biceps tendon was released and this was done with the Arthrocare wand.  Instruments were removed from the portals, which were then closed using 3-0 Vicryl suture.  At this time the operative field and  cover with Ioban an incision made off the anterolateral margin of the acromion.  Skin and subcutaneous tissue sharply divided.  Deltoid between the anterior and medial raphae was marked with #1 Vicryl suture that was then split. Bursectomy performed. The  biceps tendon was then identified and tenodesed under appropriate tension after placing a FiberLock suture through the tendon.  We then tenodesed it using 2 Arthrex knotless SutureTak anchors with good tension achieved.  This was oversewn with 3-0  Vicryl sutures.  Thorough irrigation was performed.  Deltoid split was closed using #1 Vicryl suture followed by interrupted inverted 0 Vicryl suture, 2-0 Vicryl suture, and 3-0 Monocryl with Steri-Strips applied.  Skin was then closed using 2-0 Vicryl  and 3-0 Monocryl with Steri-Strips applied.  The patient tolerated the procedure well without immediate complication.  Steri-Strips applied.  Impervious dressing was applied along with the shoulder sling.  The patient tolerated the procedure well without  immediate complications.   PUS D: 11/24/2022 5:49:31 pm T: 11/24/2022 9:01:00 pm  JOB: [id number]

## 2022-11-24 NOTE — Brief Op Note (Signed)
   11/24/2022  5:42 PM  PATIENT:  Shaune Leeks  47 y.o. male  PRE-OPERATIVE DIAGNOSIS:  right shoulder slap tear  POST-OPERATIVE DIAGNOSIS:  right shoulder slap tear  PROCEDURE:  Procedure(s): RIGHT SHOULDER ARTHROSCOPY,  extensive DEBRIDEMENT BICEPS TENODESIS  SURGEON:  Surgeon(s): Meredith Pel, MD  ASSISTANT: magnant pa  ANESTHESIA:   general  EBL: 50 ml    Total I/O In: 1400 [I.V.:1200; IV Piggyback:200] Out: 150 [Blood:150]  BLOOD ADMINISTERED: none  DRAINS: none   LOCAL MEDICATIONS USED:  none  SPECIMEN:  No Specimen  COUNTS:  YES  TOURNIQUET:  * No tourniquets in log *  DICTATION: .Other Dictation: Dictation Number [id number]  PLAN OF CARE: Discharge to home after PACU  PATIENT DISPOSITION:  PACU - hemodynamically stable

## 2022-11-24 NOTE — H&P (Signed)
Mathew Cantrell is an 47 y.o. male.   Chief Complaint: right shoulder pain HPI: Mathew Cantrell is a 47 year old patient with right shoulder pain.  Been going on since last year.  Has had MRI scan which suggested superior labral degeneration and degenerative type tear.  He has had 1 glenohumeral joint injection last year which gave him marginal relief but a bicipital groove injection gave him substantial relief.  That pain relief has now waned.  He has a history of prior [facility] joint reconstruction done in 2019 on the right-hand side.  He presents now for operative management approximation risk and benefits.  Past Medical History:  Diagnosis Date   Hypertension    Medical history non-contributory    Pneumonia     Past Surgical History:  Procedure Laterality Date   ACROMIO-CLAVICULAR JOINT REPAIR Right 08/09/2018   Procedure: RIGHT ACROMIOCLAVICULAR JOINT REPAIR;  Surgeon: Meredith Pel, MD;  Location: [facility];  Service: Orthopedics;  Laterality: Right;   EYE SURGERY Bilateral    lasix   KNEE SURGERY Right    torn cartledge   TONSILLECTOMY      No family history on file. Social History:  reports that he has never smoked. He has never used smokeless tobacco. He reports current alcohol use. He reports that he does not use drugs.  Allergies: No Known Allergies  Medications Prior to Admission  Medication Sig Dispense Refill   amLODipine (NORVASC) 10 MG tablet Take 10 mg by mouth daily.     Coenzyme Q10 (CO Q-10 PO) Take 1 capsule by mouth daily.     hydrochlorothiazide (HYDRODIURIL) 25 MG tablet Take 25 mg by mouth daily.     lisinopril (ZESTRIL) 40 MG tablet Take 40 mg by mouth daily.     MAGNESIUM-OXIDE 400 (240 Mg) MG tablet Take 400 mg by mouth daily.     Multiple Vitamin (MULTIVITAMIN WITH MINERALS) TABS tablet Take 1 tablet by mouth daily.     TURMERIC PO Take 1 capsule by mouth daily.     diazepam (VALIUM) 10 MG tablet Take 1 tablet (10 mg total) by mouth every 8 (eight) hours as needed  for muscle spasms. (Patient not taking: Reported on 11/16/2022) 20 tablet 0   diclofenac (VOLTAREN) 75 MG EC tablet 1 po bid x 2 wks (Patient not taking: Reported on 11/16/2022) 30 tablet 0   HYDROcodone-acetaminophen (NORCO/VICODIN) 5-325 MG tablet Take 1 tablet by mouth every 8 (eight) hours as needed for moderate pain. (Patient not taking: Reported on 11/16/2022) 35 tablet 0   methocarbamol (ROBAXIN) 500 MG tablet Take 1 tablet (500 mg total) by mouth every 8 (eight) hours as needed for muscle spasms. (Patient not taking: Reported on 11/16/2022) 30 tablet 0   oxyCODONE-acetaminophen (PERCOCET/ROXICET) 5-325 MG tablet TAKE 1 TAB PO Q 6-8 HOURS PRN PAIN (Patient not taking: Reported on 11/16/2022) 40 tablet 0    No results found for this or any previous visit (from the past 48 hour(s)). No results found.  Review of Systems  Musculoskeletal:  Positive for arthralgias.  All other systems reviewed and are negative.   Blood pressure (!) 164/114, pulse (!) 107, temperature 97.7 F (36.5 C), temperature source Oral, height 6\' 4"  (1.93 m), weight 118.8 kg, SpO2 97 %. Physical Exam Vitals reviewed.  HENT:     Head: Normocephalic.     Nose: Nose normal.     Mouth/Throat:     Mouth: Mucous membranes are moist.  Eyes:     Pupils: Pupils are equal,  round, and reactive to light.  Cardiovascular:     Rate and Rhythm: Normal rate.     Pulses: Normal pulses.  Pulmonary:     Effort: Pulmonary effort is normal.  Abdominal:     General: Abdomen is flat.  Musculoskeletal:     Cervical back: Normal range of motion.  Skin:    General: Skin is warm.     Capillary Refill: Capillary refill takes less than 2 seconds.  Neurological:     General: No focal deficit present.     Mental Status: He is alert.  Psychiatric:        Mood and Affect: Mood normal.      Ortho exam demonstrates right shoulder with 60 degrees X rotation, 100 degrees abduction, 180 degrees forward flexion.  Axillary nerve is intact  with deltoid firing on exam today.  Excellent strength of supra, infra, subscap with absolutely no reproduction of pain.  Moderate tenderness overlying the bicipital groove.  No tenderness over the [facility] joint.  No coarse grinding or crepitus noted passive motion of the shoulder.  Excellent strength of supination and bicep flexion with a little bit of increased pain from this.  No atrophy noted.  Negative belly press test  Assessment/Plan Impression is superior labral pathology with good relief from bicipital groove injection.  Plan is right shoulder arthroscopy with debridement of the superior labrum and biceps tenodesis.  Do not think this is related to any hardware from the prior [facility] joint reconstruction.  Risk and benefits are discussed with the patient including not limited to infection or vessel damage incomplete pain relief as well as possible need for repeat surgery if the biceps tendon does not adhere in the groove.  Patient understands risk and benefits.  Wishes to proceed.  All questions answered.  Anderson Malta, MD 11/24/2022, 2:16 PM

## 2022-11-25 NOTE — Anesthesia Postprocedure Evaluation (Signed)
Anesthesia Post Note  Patient: Hobson Shoenfelt  Procedure(s) Performed: RIGHT SHOULDER ARTHROSCOPY, DEBRIDEMENT (Right: Shoulder) BICEPS TENODESIS (Right: Shoulder)     Patient location during evaluation: PACU Anesthesia Type: General and Regional Level of consciousness: awake and alert Pain management: pain level controlled Vital Signs Assessment: post-procedure vital signs reviewed and stable Respiratory status: spontaneous breathing, nonlabored ventilation and respiratory function stable Cardiovascular status: blood pressure returned to baseline and stable Postop Assessment: no apparent nausea or vomiting Anesthetic complications: no  There were no known notable events for this encounter.  Last Vitals:  Vitals:   11/24/22 1815 11/24/22 1830  BP: (!) 140/100 (!) 153/104  Pulse: 91 90  Resp: 19 17  Temp:  36.7 C  SpO2: 94% 94%    Last Pain:  Vitals:   11/24/22 1830  TempSrc:   PainSc: 0-No pain                 Bobie Caris,W. EDMOND

## 2022-11-29 ENCOUNTER — Telehealth: Payer: Self-pay | Admitting: Orthopedic Surgery

## 2022-11-29 ENCOUNTER — Other Ambulatory Visit: Payer: Self-pay | Admitting: Surgical

## 2022-11-29 MED ORDER — OXYCODONE HCL 5 MG PO TABS: 5.0000 mg | ORAL_TABLET | ORAL | 0 refills | Status: DC | PRN

## 2022-11-29 NOTE — Telephone Encounter (Signed)
From: Shaune Leeks To: Office of [facility], [place] Sent: 11/28/2022 6:43 PM EDT Subject: Medication Renewal Request  Refills have been requested for the following medications:   oxyCODONE (ROXICODONE) 5 MG immediate release tablet [Luke Lauro Manlove]  Preferred pharmacy: [id number] - [place], [place] - [address] Delivery method: [employer]

## 2022-11-29 NOTE — Telephone Encounter (Signed)
Patient requesting refill on Oxycodone please advise

## 2022-11-29 NOTE — Telephone Encounter (Signed)
Lvm advising  

## 2022-11-29 NOTE — Telephone Encounter (Signed)
Sent in

## 2022-12-02 ENCOUNTER — Telehealth: Payer: Self-pay

## 2022-12-02 ENCOUNTER — Encounter: Payer: Self-pay | Admitting: Surgical

## 2022-12-02 ENCOUNTER — Ambulatory Visit (INDEPENDENT_AMBULATORY_CARE_PROVIDER_SITE_OTHER): Payer: BC Managed Care – PPO | Admitting: Surgical

## 2022-12-02 DIAGNOSIS — S43432A Superior glenoid labrum lesion of left shoulder, initial encounter: Secondary | ICD-10-CM

## 2022-12-02 DIAGNOSIS — M94211 Chondromalacia, right shoulder: Secondary | ICD-10-CM

## 2022-12-02 DIAGNOSIS — M7521 Bicipital tendinitis, right shoulder: Secondary | ICD-10-CM

## 2022-12-02 NOTE — Progress Notes (Signed)
Post-Op Visit Note   Patient: Mathew Cantrell           Date of Birth: 08-Apr-1976           MRN: [id number] Visit Date: 12/02/2022 PCP: [facility]   Assessment & Plan:  Chief Complaint:  Chief Complaint  Patient presents with   Right Shoulder - Routine Post Op   Visit Diagnoses:  1. Biceps tendinitis of right shoulder     Plan: Mathew Cantrell is a 47 y.o. male who presents s/p right shoulder biceps tenodesis on 11/24/2022.  Patient is doing well and pain is overall controlled.  Not using CPM machine.  Denies any chest pain, SOB, fevers, chills. Taking pain medication every 6 hours which is improved from several days ago when he had to take it every 4 hours.  No pain medication so far today.  He has not returned to work yet..    On exam, patient has range of motion 20 degrees external rotation, 60 degrees abduction, 80 degrees forward elevation passively..  Intact EPL, FPL, finger abduction, finger adduction, pronation/supination, bicep, tricep, deltoid of operative extremity.  Axillary nerve intact with deltoid firing.  Incisions are healing well without evidence of infection or dehiscence.  Sutures removed and replaced with Steri-Strips today.  2+ radial pulse of the operative extremity  Plan is to start CPM machine to work on passive motion.  He is okay for active range of motion of the operative extremity but no lifting more than the weight of the cell phone with the right arm.  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  We will set him up for CPM machine because he feels to be a little bit on the stiff side following surgery.  Follow-up in 4 weeks for clinical recheck and evaluation by [name] at that time.  We also discussed the irregular skin lesion he has on the posterior aspect of his superior shoulder.  He states that he had this evaluated by dermatology several months ago and he was told that this is a wisdom spot..   Follow-Up  Instructions: No follow-ups on file.   Orders:  No orders of the defined types were placed in this encounter.  No orders of the defined types were placed in this encounter.   Imaging: No results found.  PMFS History: Patient Active Problem List   Diagnosis Date Noted   AC separation, type 3, right, initial encounter    Past Medical History:  Diagnosis Date   Hypertension    Medical history non-contributory    Pneumonia     No family history on file.  Past Surgical History:  Procedure Laterality Date   ACROMIO-CLAVICULAR JOINT REPAIR Right 08/09/2018   Procedure: RIGHT ACROMIOCLAVICULAR JOINT REPAIR;  Surgeon: [name], MD;  Location: [facility] OR;  Service: Orthopedics;  Laterality: Right;   BICEPT TENODESIS Right 11/24/2022   Procedure: BICEPS TENODESIS;  Surgeon: [name], MD;  Location: [facility] OR;  Service: Orthopedics;  Laterality: Right;   EYE SURGERY Bilateral    lasix   KNEE SURGERY Right    torn cartledge   SHOULDER ARTHROSCOPY Right 11/24/2022   Procedure: RIGHT SHOULDER ARTHROSCOPY, DEBRIDEMENT;  Surgeon: [name], MD;  Location: [facility] OR;  Service: Orthopedics;  Laterality: Right;   TONSILLECTOMY     Social History   Occupational History   Not on file  Tobacco Use   Smoking status: Never  Smokeless tobacco: Never  Vaping Use   Vaping Use: Never used  Substance and Sexual Activity   Alcohol use: Yes    Comment: wine every few weeks   Drug use: Never   Sexual activity: Not on file

## 2022-12-02 NOTE — Telephone Encounter (Signed)
Can we get this patient sent up for a CPM please

## 2022-12-05 ENCOUNTER — Telehealth: Payer: Self-pay | Admitting: Orthopedic Surgery

## 2022-12-05 ENCOUNTER — Other Ambulatory Visit: Payer: Self-pay | Admitting: Surgical

## 2022-12-05 MED ORDER — OXYCODONE HCL 5 MG PO TABS: 5.0000 mg | ORAL_TABLET | Freq: Four times a day (QID) | ORAL | 0 refills | Status: AC | PRN

## 2022-12-05 NOTE — Telephone Encounter (Signed)
Order sent.

## 2022-12-05 NOTE — Telephone Encounter (Signed)
Patient requesting Refill on Oxycodone sent to [facility]

## 2022-12-05 NOTE — Telephone Encounter (Signed)
Sent in

## 2022-12-05 NOTE — Telephone Encounter (Signed)
I called patient and advised. 

## 2023-01-04 ENCOUNTER — Encounter: Payer: Self-pay | Admitting: Orthopedic Surgery

## 2023-01-04 ENCOUNTER — Ambulatory Visit (INDEPENDENT_AMBULATORY_CARE_PROVIDER_SITE_OTHER): Payer: BC Managed Care – PPO | Admitting: Orthopedic Surgery

## 2023-01-04 DIAGNOSIS — M7521 Bicipital tendinitis, right shoulder: Secondary | ICD-10-CM

## 2023-01-04 NOTE — Progress Notes (Signed)
   Post-Op Visit Note   Patient: Mathew Cantrell           Date of Birth: 10-Aug-1976           MRN: [id number] Visit Date: 01/04/2023 PCP: [facility]   Assessment & Plan:  Chief Complaint:  Chief Complaint  Patient presents with   Right Shoulder - Routine Post Op    right shoulder biceps tenodesis on 11/24/2022   Visit Diagnoses: No diagnosis found.  Plan: Patient presents now about 6 weeks out right shoulder arthroscopy with biceps tenodesis.  Had a large bone fragment in the bicipital groove.  Doing better overall.  Also had a large chondral defect on the posterior humeral head.  On examination he has good range of motion above 90 degrees of forward flexion and abduction.  Biceps contour intact but still weak.  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  I think he is okay for regular duty with no restrictions in 4 weeks.  Would not want him to get into a combat situation before the next 4 weeks.  We talked about how to work out and not put too much pressure on the shoulder.  If he continues to workout at a high compressive load then he would likely be looking at shoulder replacement within 5 to 7 years.  He will follow-up as needed.  Follow-Up Instructions: No follow-ups on file.   Orders:  No orders of the defined types were placed in this encounter.  No orders of the defined types were placed in this encounter.   Imaging: No results found.  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  Past Surgical History:  Procedure Laterality Date   ACROMIO-CLAVICULAR JOINT REPAIR Right 08/09/2018    Procedure: RIGHT ACROMIOCLAVICULAR JOINT REPAIR;  Surgeon: [name], MD;  Location: [facility] OR;  Service: Orthopedics;  Laterality: Right;   BICEPT TENODESIS Right 11/24/2022   Procedure: BICEPS TENODESIS;  Surgeon: [name], MD;  Location: [facility] OR;  Service: Orthopedics;  Laterality: Right;   EYE SURGERY Bilateral    lasix   KNEE SURGERY Right    torn cartledge   SHOULDER ARTHROSCOPY Right 11/24/2022   Procedure: RIGHT SHOULDER ARTHROSCOPY, DEBRIDEMENT;  Surgeon: [name], MD;  Location: [facility] OR;  Service: Orthopedics;  Laterality: Right;   TONSILLECTOMY     Social History   Occupational History   Not on file  Tobacco Use   Smoking status: Never   Smokeless tobacco: Never  Vaping Use   Vaping Use: Never used  Substance and Sexual Activity   Alcohol use: Yes    Comment: wine every few weeks   Drug use: Never   Sexual activity: Not on file
